# Patient Record
Sex: Female | Born: 1960 | Race: White | Hispanic: No | Marital: Married | Smoking: Never smoker
Health system: Southern US, Community
[De-identification: ages and names within clinical notes are randomized; demographics above are authoritative.]

## PROBLEM LIST (undated history)

## (undated) DIAGNOSIS — R87613 High grade squamous intraepithelial lesion on cytologic smear of cervix (HGSIL): Secondary | ICD-10-CM

## (undated) DIAGNOSIS — C801 Malignant (primary) neoplasm, unspecified: Secondary | ICD-10-CM

## (undated) DIAGNOSIS — N939 Abnormal uterine and vaginal bleeding, unspecified: Secondary | ICD-10-CM

## (undated) HISTORY — DX: High grade squamous intraepithelial lesion on cytologic smear of cervix (HGSIL): R87.613

---

## 2005-03-16 HISTORY — PX: DILATION AND CURETTAGE OF UTERUS: SHX78

## 2005-07-14 HISTORY — PX: OTHER SURGICAL HISTORY: SHX169

## 2013-06-22 ENCOUNTER — Emergency Department (HOSPITAL_COMMUNITY)
Admission: EM | Admit: 2013-06-22 | Discharge: 2013-06-22 | Disposition: A | Discharge status: Home / Self Care | Payer: BC Managed Care – PPO | Attending: Emergency Medicine | Admitting: Emergency Medicine

## 2013-06-22 ENCOUNTER — Encounter (HOSPITAL_COMMUNITY): Payer: Self-pay | Admitting: Emergency Medicine

## 2013-06-22 DIAGNOSIS — R209 Unspecified disturbances of skin sensation: Secondary | ICD-10-CM | POA: Insufficient documentation

## 2013-06-22 DIAGNOSIS — R110 Nausea: Secondary | ICD-10-CM | POA: Diagnosis present

## 2013-06-22 DIAGNOSIS — R112 Nausea with vomiting, unspecified: Secondary | ICD-10-CM | POA: Insufficient documentation

## 2013-06-22 DIAGNOSIS — R1110 Vomiting, unspecified: Secondary | ICD-10-CM | POA: Diagnosis present

## 2013-06-22 DIAGNOSIS — R51 Headache: Secondary | ICD-10-CM | POA: Insufficient documentation

## 2013-06-22 MED ORDER — DIPHENHYDRAMINE HCL 50 MG/ML IJ SOLN
12.5000 mg | Freq: Once | INTRAMUSCULAR | Status: AC
  Administered 2013-06-22: 08:00:00 via INTRAVENOUS
  Filled 2013-06-22: qty 1

## 2013-06-22 MED ORDER — ONDANSETRON 4 MG PO TBDP: ORAL_TABLET | ORAL | Status: DC

## 2013-06-22 MED ORDER — SODIUM CHLORIDE 0.9 % IV BOLUS (SEPSIS)
1000.0000 mL | INTRAVENOUS | Status: AC
  Administered 2013-06-22: 1000 mL via INTRAVENOUS

## 2013-06-22 MED ORDER — METOCLOPRAMIDE HCL 5 MG/ML IJ SOLN
5.0000 mg | Freq: Once | INTRAMUSCULAR | Status: AC
  Administered 2013-06-22: 5 mg via INTRAVENOUS
  Filled 2013-06-22: qty 2

## 2013-06-22 NOTE — ED Notes (Signed)
Pt transported via EMS from home with c/o emesis onset last evening. Dry heaving noted by EMS. #20 SL L hand established by EMS, Zofran 4mg  IVP given by EMS.

## 2013-06-22 NOTE — ED Notes (Signed)
Bed: TR32 Expected date:  Expected time:  Means of arrival:  Comments: EMS 55F Emesis VSS

## 2013-06-22 NOTE — ED Notes (Signed)
Ambulated in the hallway with no assistance. Patient denies any dizziness or weakness.

## 2013-06-22 NOTE — ED Provider Notes (Signed)
CSN: [id number]     Arrival date & time 06/22/13  4132 History   First MD Initiated Contact with Patient 06/22/13 0700     Chief Complaint  Patient presents with  . Emesis     (Consider location/radiation/quality/duration/timing/severity/associated sxs/prior Treatment) Patient is a 53 y.o. female presenting with vomiting. The history is provided by the patient.  Emesis Severity:  Mild Duration:  12 hours Timing:  Intermittent Quality:  Stomach contents Progression:  Worsening Chronicity:  New Recent urination:  Normal Relieved by:  Nothing Worsened by:  Nothing tried Ineffective treatments:  None tried Associated symptoms: headaches   Associated symptoms: no abdominal pain, no diarrhea and no fever     History reviewed. No pertinent past medical history. History reviewed. No pertinent past surgical history. No family history on file. History  Substance Use Topics  . Smoking status: Never Smoker   . Smokeless tobacco: Not on file  . Alcohol Use: No   OB History   Grav Para Term Preterm Abortions TAB SAB Ect Mult Living                 Review of Systems  Constitutional: Negative for fever and fatigue.  HENT: Negative for congestion and drooling.   Eyes: Negative for pain.  Respiratory: Negative for cough and shortness of breath.   Cardiovascular: Negative for chest pain.  Gastrointestinal: Positive for nausea and vomiting. Negative for abdominal pain and diarrhea.  Genitourinary: Negative for dysuria and hematuria.  Musculoskeletal: Negative for back pain, gait problem and neck pain.  Skin: Negative for color change.  Neurological: Positive for headaches. Negative for dizziness.       Paresthesias in her bilateral hands  Hematological: Negative for adenopathy.  Psychiatric/Behavioral: Negative for behavioral problems.  All other systems reviewed and are negative.     Allergies  Clindamycin/lincomycin  Home Medications  No current outpatient prescriptions on  file. BP 164/81  Pulse 62  Temp(Src) 97.4 F (36.3 C) (Oral)  Resp 16  SpO2 100% Physical Exam  Nursing note and vitals reviewed. Constitutional: She is oriented to person, place, and time. She appears well-developed and well-nourished.  Nauseous.   HENT:  Head: Normocephalic.  Mouth/Throat: Oropharynx is clear and moist. No oropharyngeal exudate.  Eyes: Conjunctivae and EOM are normal. Pupils are equal, round, and reactive to light.  Neck: Normal range of motion. Neck supple.  Cardiovascular: Normal rate, regular rhythm, normal heart sounds and intact distal pulses.  Exam reveals no gallop and no friction rub.   No murmur heard. Pulmonary/Chest: Effort normal and breath sounds normal. No respiratory distress. She has no wheezes.  Abdominal: Soft. Bowel sounds are normal. There is no tenderness. There is no rebound and no guarding.  Musculoskeletal: Normal range of motion. She exhibits no edema and no tenderness.  Neurological: She is alert and oriented to person, place, and time. She has normal strength. No cranial nerve deficit or sensory deficit. She displays a negative Romberg sign. Coordination and gait normal.  alert, oriented x3 speech: normal in context and clarity memory: intact grossly cranial nerves II-XII: intact motor strength: full proximally and distally no involuntary movements or tremors sensation: intact to light touch diffusely  cerebellar: finger-to-nose intact gait: deferred d/t vomiting on exam   Skin: Skin is warm and dry.  Psychiatric: She has a normal mood and affect. Her behavior is normal.    ED Course  Procedures (including critical care time) Labs Review Labs Reviewed - No data to display Imaging Review  No results found.   EKG Interpretation None      MDM   Final diagnoses:  Vomiting  Nausea    7:12 AM 53 y.o. female who presents with vomiting which began last night. She notes she was able to sleep through the night but awoke this  morning with continued vomiting. Shows a developed a headache which felt like pressure on the vertex of her head after vomiting. She denies this on exam now. She states that she is otherwise been well but there has been sick contacts at her work. She denies any abdominal pain, diarrhea, or fever. She is afebrile and vital signs are unremarkable here. Will treat symptomatically with IV fluid and nausea medicine.  8:17 AM: Pt feeling much better, would like to go. Likely viral syndrome. Ambulating w/out difficulty upon d/c home.  I have discussed the diagnosis/risks/treatment options with the patient and believe the pt to be eligible for discharge home to follow-up with pcp as needed. We also discussed returning to the ED immediately if new or worsening sx occur. We discussed the sx which are most concerning (e.g., inability to tolerate po, abd pain, inc vomiting) that necessitate immediate return. Medications administered to the patient during their visit and any new prescriptions provided to the patient are listed below.  Medications given during this visit Medications  sodium chloride 0.9 % bolus 1,000 mL (1,000 mLs Intravenous New Bag/Given 06/22/13 0727)  metoCLOPramide (REGLAN) injection 5 mg (5 mg Intravenous Given 06/22/13 0728)  diphenhydrAMINE (BENADRYL) injection 12.5 mg ( Intravenous Given 06/22/13 0730)    New Prescriptions   ONDANSETRON (ZOFRAN ODT) 4 MG DISINTEGRATING TABLET    4mg  ODT q4 hours prn nausea/vomit       Blanchard Kelch, MD 06/22/13 [phone]

## 2013-06-22 NOTE — Discharge Instructions (Signed)

## 2014-03-18 ENCOUNTER — Other Ambulatory Visit: Payer: Self-pay | Admitting: Family Medicine

## 2014-04-13 ENCOUNTER — Encounter: Payer: Self-pay | Admitting: Nurse Practitioner

## 2014-06-21 ENCOUNTER — Encounter: Payer: Self-pay | Admitting: Obstetrics and Gynecology

## 2014-06-21 ENCOUNTER — Ambulatory Visit (INDEPENDENT_AMBULATORY_CARE_PROVIDER_SITE_OTHER): Payer: BLUE CROSS/BLUE SHIELD | Admitting: Obstetrics and Gynecology

## 2014-06-21 VITALS — BP 126/80 | HR 76 | Resp 14 | Ht 63.5 in | Wt 168.0 lb

## 2014-06-21 DIAGNOSIS — Z1211 Encounter for screening for malignant neoplasm of colon: Secondary | ICD-10-CM | POA: Diagnosis not present

## 2014-06-21 DIAGNOSIS — Z0000 Encounter for general adult medical examination without abnormal findings: Secondary | ICD-10-CM

## 2014-06-21 DIAGNOSIS — R87613 High grade squamous intraepithelial lesion on cytologic smear of cervix (HGSIL): Secondary | ICD-10-CM

## 2014-06-21 DIAGNOSIS — N912 Amenorrhea, unspecified: Secondary | ICD-10-CM | POA: Diagnosis not present

## 2014-06-21 DIAGNOSIS — N644 Mastodynia: Secondary | ICD-10-CM

## 2014-06-21 DIAGNOSIS — Z01419 Encounter for gynecological examination (general) (routine) without abnormal findings: Secondary | ICD-10-CM

## 2014-06-21 HISTORY — DX: High grade squamous intraepithelial lesion on cytologic smear of cervix (HGSIL): R87.613

## 2014-06-21 LAB — POCT URINALYSIS DIPSTICK
BILIRUBIN UA: NEGATIVE
Blood, UA: NEGATIVE
GLUCOSE UA: NEGATIVE
Ketones, UA: NEGATIVE
Leukocytes, UA: NEGATIVE
NITRITE UA: NEGATIVE
PH UA: 5
Protein, UA: NEGATIVE
Urobilinogen, UA: NEGATIVE

## 2014-06-21 LAB — CBC
HCT: 37.9 % (ref 36.0–46.0)
HEMOGLOBIN: 12.5 g/dL (ref 12.0–15.0)
MCH: 27.2 pg (ref 26.0–34.0)
MCHC: 33 g/dL (ref 30.0–36.0)
MCV: 82.4 fL (ref 78.0–100.0)
MPV: 9.4 fL (ref 8.6–12.4)
Platelets: 329 10*3/uL (ref 150–400)
RBC: 4.6 MIL/uL (ref 3.87–5.11)
RDW: 13.9 % (ref 11.5–15.5)
WBC: 6.9 10*3/uL (ref 4.0–10.5)

## 2014-06-21 NOTE — Progress Notes (Signed)
Patient is scheduled for Bilateral Breast Diagnostic Mammogram and L Breast Ultrasound at [facility] on 06/27/14 at 1500 . Patient agreeable to time/date/location. Follow up appointment with Dr. Quincy Simmonds scheduled for 07/11/14.

## 2014-06-21 NOTE — Patient Instructions (Signed)
EXERCISE AND DIET:  We recommended that you start or continue a regular exercise program for good health. Regular exercise means any activity that makes your heart beat faster and makes you sweat.  We recommend exercising at least 30 minutes per day at least 3 days a week, preferably 4 or 5.  We also recommend a diet low in fat and sugar.  Inactivity, poor dietary choices and obesity can cause diabetes, heart attack, stroke, and kidney damage, among others.    ALCOHOL AND SMOKING:  Women should limit their alcohol intake to no more than 7 drinks/beers/glasses of wine (combined, not each!) per week. Moderation of alcohol intake to this level decreases your risk of breast cancer and liver damage. And of course, no recreational drugs are part of a healthy lifestyle.  And absolutely no smoking or even second hand smoke. Most people know smoking can cause heart and lung diseases, but did you know it also contributes to weakening of your bones? Aging of your skin?  Yellowing of your teeth and nails?  CALCIUM AND VITAMIN D:  Adequate intake of calcium and Vitamin D are recommended.  The recommendations for exact amounts of these supplements seem to change often, but generally speaking 600 mg of calcium (either carbonate or citrate) and 800 units of Vitamin D per day seems prudent. Certain women may benefit from higher intake of Vitamin D.  If you are among these women, your doctor will have told you during your visit.    PAP SMEARS:  Pap smears, to check for cervical cancer or precancers,  have traditionally been done yearly, although recent scientific advances have shown that most women can have pap smears less often.  However, every woman still should have a physical exam from her gynecologist every year. It will include a breast check, inspection of the vulva and vagina to check for abnormal growths or skin changes, a visual exam of the cervix, and then an exam to evaluate the size and shape of the uterus and  ovaries.  And after 54 years of age, a rectal exam is indicated to check for rectal cancers. We will also provide age appropriate advice regarding health maintenance, like when you should have certain vaccines, screening for sexually transmitted diseases, bone density testing, colonoscopy, mammograms, etc.   MAMMOGRAMS:  All women over 40 years old should have a yearly mammogram. Many facilities now offer a "3D" mammogram, which may cost around $50 extra out of pocket. If possible,  we recommend you accept the option to have the 3D mammogram performed.  It both reduces the number of women who will be called back for extra views which then turn out to be normal, and it is better than the routine mammogram at detecting truly abnormal areas.    COLONOSCOPY:  Colonoscopy to screen for colon cancer is recommended for all women at age 50.  We know, you hate the idea of the prep.  We agree, BUT, having colon cancer and not knowing it is worse!!  Colon cancer so often starts as a polyp that can be seen and removed at colonscopy, which can quite literally save your life!  And if your first colonoscopy is normal and you have no family history of colon cancer, most women don't have to have it again for 10 years.  Once every ten years, you can do something that may end up saving your life, right?  We will be happy to help you get it scheduled when you are ready.    Be sure to check your insurance coverage so you understand how much it will cost.  It may be covered as a preventative service at no cost, but you should check your particular policy.     Exercise to Lose Weight Exercise and a healthy diet may help you lose weight. Your doctor may suggest specific exercises. EXERCISE IDEAS AND TIPS  Choose low-cost things you enjoy doing, such as walking, bicycling, or exercising to workout videos.  Take stairs instead of the elevator.  Walk during your lunch break.  Park your car further away from work or  school.  Go to a gym or an exercise class.  Start with 5 to 10 minutes of exercise each day. Build up to 30 minutes of exercise 4 to 6 days a week.  Wear shoes with good support and comfortable clothes.  Stretch before and after working out.  Work out until you breathe harder and your heart beats faster.  Drink extra water when you exercise.  Do not do so much that you hurt yourself, feel dizzy, or get very short of breath. Exercises that burn about 150 calories:  Running 1  miles in 15 minutes.  Playing volleyball for 45 to 60 minutes.  Washing and waxing a car for 45 to 60 minutes.  Playing touch football for 45 minutes.  Walking 1  miles in 35 minutes.  Pushing a stroller 1  miles in 30 minutes.  Playing basketball for 30 minutes.  Raking leaves for 30 minutes.  Bicycling 5 miles in 30 minutes.  Walking 2 miles in 30 minutes.  Dancing for 30 minutes.  Shoveling snow for 15 minutes.  Swimming laps for 20 minutes.  Walking up stairs for 15 minutes.  Bicycling 4 miles in 15 minutes.  Gardening for 30 to 45 minutes.  Jumping rope for 15 minutes.  Washing windows or floors for 45 to 60 minutes. Document Released: 04/04/2010 Document Revised: 05/25/2011 Document Reviewed: 04/04/2010 [facility] Patient Information 2015 [place], [place]. This information is not intended to replace advice given to you by your health care provider. Make sure you discuss any questions you have with your health care provider.

## 2014-06-21 NOTE — Progress Notes (Signed)
Patient ID: Teresa Kaiser, female   DOB: 1961/02/08, 54 y.o.   MRN: [id number] 54 y.o. G1P1001 MarriedCaucasianF here for annual exam.   Left breast pain at 9:00.  No mass felt.   Hot flashes. No prior HRT.  Has gained about 20 pounds.   Mother decreased with endometrial cancer at age 22 years old.   Works with the blind.   PCP:  [facility]  Patient's last menstrual period was 10/14/2012 (approximate).          Sexually active: Yes.   female partner The current method of family planning is post menopausal status.    Exercising: No.  none. Smoker:  no  Health Maintenance: Pap:  2-3 years [id number] History of abnormal Pap:  no MMG:  2013 normal in [place] Colonoscopy:  never BMD:   n/a TDaP:  Unsure.   Screening Labs:   Hb today: 12.1, Urine today: neg   reports that she has never smoked. She does not have any smokeless tobacco history on file. She reports that she does not drink alcohol or use illicit drugs.  No past medical history on file.  Past Surgical History  Procedure Laterality Date  . Fatty tumor removal  07/2005    --benign--under left breast-was actually on ribcage--done in [facility]    No current outpatient prescriptions on file.   No current facility-administered medications for this visit.    Family History  Problem Relation Age of Onset  . Cancer Mother 2    dec--?endometrial ca  . Heart attack Father 41    dec    ROS:  Pertinent items are noted in HPI.  Otherwise, a comprehensive ROS was negative.  Exam:   BP 126/80 mmHg  Pulse 76  Resp 14  Ht 5' 3.5" (1.613 m)  Wt 168 lb (76.204 kg)  BMI 29.29 kg/m2  LMP 10/14/2012 (Approximate)      Height: 5' 3.5" (161.3 cm)  Ht Readings from Last 3 Encounters:  06/21/14 5' 3.5" (1.613 m)    General appearance: alert, cooperative and appears stated age Head: Normocephalic, without obvious abnormality, atraumatic Neck: no adenopathy, supple, symmetrical, trachea midline and  thyroid normal to inspection and palpation Lungs: clear to auscultation bilaterally Breasts: normal appearance, no masses or tenderness, Inspection negative, No nipple retraction or dimpling, No nipple discharge or bleeding, No axillary or supraclavicular adenopathy Heart: regular rate and rhythm Abdomen: soft, non-tender; bowel sounds normal; no masses,  no organomegaly Extremities: extremities normal, atraumatic, no cyanosis or edema Skin: Skin color, texture, turgor normal. No rashes or lesions Lymph nodes: Cervical, supraclavicular, and axillary nodes normal. No abnormal inguinal nodes palpated Neurologic: Grossly normal   Pelvic: External genitalia:  no lesions              Urethra:  normal appearing urethra with no masses, tenderness or lesions              Bartholins and Skenes: normal                 Vagina: normal appearing vagina with normal color and discharge, no lesions              Cervix: no lesions              Pap taken: Yes.   Bimanual Exam:  Uterus:  normal size, contour, position, consistency, mobility, non-tender              Adnexa: normal adnexa and no mass, fullness, tenderness  Rectovaginal: Confirms               Anus:  normal sphincter tone, no lesions  Chaperone was present for exam.  A:  Well Woman with normal exam Left breast pain.  Weight gain.  Menopausal symptoms.  Family history of uterine cancer.   P:   Bilateral diagnostic mammogram and possible left breast ultrasound.  Will schedule for patient.  pap smear and HR HPV.  Check FSH, estradiol, TSH, lipids, CMP, CBC.  Discussed treatment of hot flashes - HRT, Brisdelle, Effexor, herbal options.  Discussed weight loss and increasing exercise.  Patient will return for a talking visit after mammogram done and labs back.  Will determine treatment for menopausal symptoms then.  Reading about menopause to patient in written form. TDap was not given prior to discharge from visit.  Will need  to be given at another appointment. return annually or prn

## 2014-06-22 LAB — COMPREHENSIVE METABOLIC PANEL
ALBUMIN: 4.2 g/dL (ref 3.5–5.2)
ALT: 18 U/L (ref 0–35)
AST: 16 U/L (ref 0–37)
Alkaline Phosphatase: 65 U/L (ref 39–117)
BUN: 13 mg/dL (ref 6–23)
CHLORIDE: 104 meq/L (ref 96–112)
CO2: 29 meq/L (ref 19–32)
Calcium: 9.2 mg/dL (ref 8.4–10.5)
Creat: 0.6 mg/dL (ref 0.50–1.10)
Glucose, Bld: 76 mg/dL (ref 70–99)
POTASSIUM: 4.8 meq/L (ref 3.5–5.3)
Sodium: 141 mEq/L (ref 135–145)
Total Bilirubin: 0.3 mg/dL (ref 0.2–1.2)
Total Protein: 7.3 g/dL (ref 6.0–8.3)

## 2014-06-22 LAB — LIPID PANEL
Cholesterol: 199 mg/dL (ref 0–200)
HDL: 35 mg/dL — AB (ref >=46)
LDL CALC: 112 mg/dL — AB (ref 0–99)
TRIGLYCERIDES: 258 mg/dL — AB (ref <150)
Total CHOL/HDL Ratio: 5.7 Ratio
VLDL: 52 mg/dL — ABNORMAL HIGH (ref 0–40)

## 2014-06-22 LAB — ESTRADIOL: ESTRADIOL: 15.6 pg/mL

## 2014-06-22 LAB — FOLLICLE STIMULATING HORMONE: FSH: 85.4 m[IU]/mL

## 2014-06-22 LAB — TSH: TSH: 1.849 u[IU]/mL (ref 0.350–4.500)

## 2014-06-25 LAB — HEMOGLOBIN, FINGERSTICK: Hemoglobin, fingerstick: 12.1 g/dL (ref 12.0–16.0)

## 2014-06-27 ENCOUNTER — Ambulatory Visit
Admission: RE | Admit: 2014-06-27 | Discharge: 2014-06-27 | Disposition: A | Discharge status: Home / Self Care | Payer: BLUE CROSS/BLUE SHIELD | Source: Ambulatory Visit | Attending: Obstetrics and Gynecology | Admitting: Obstetrics and Gynecology

## 2014-06-27 ENCOUNTER — Other Ambulatory Visit: Payer: Self-pay | Admitting: Obstetrics and Gynecology

## 2014-06-27 DIAGNOSIS — R921 Mammographic calcification found on diagnostic imaging of breast: Secondary | ICD-10-CM

## 2014-06-27 DIAGNOSIS — N644 Mastodynia: Secondary | ICD-10-CM

## 2014-06-27 LAB — IPS PAP TEST WITH HPV

## 2014-06-28 ENCOUNTER — Telehealth: Payer: Self-pay | Admitting: Obstetrics and Gynecology

## 2014-06-28 NOTE — Telephone Encounter (Signed)
Patient returning call. She will be available at 12:00 PM if you get a chance to call her back then.

## 2014-06-28 NOTE — Telephone Encounter (Signed)
Left message for patient to call back. Need to advise that she is scheduled with Dr Collene Mares 04.19.2016 @ [phone].

## 2014-06-28 NOTE — Telephone Encounter (Signed)
Patient returned call. I advised her of the appointment with Dr Collene Mares and gave her there location and contact information. Patient agreeable.

## 2014-06-28 NOTE — Telephone Encounter (Signed)
Left message for patient to call back  

## 2014-07-02 ENCOUNTER — Telehealth: Payer: Self-pay | Admitting: Emergency Medicine

## 2014-07-02 DIAGNOSIS — R87613 High grade squamous intraepithelial lesion on cytologic smear of cervix (HGSIL): Secondary | ICD-10-CM

## 2014-07-02 NOTE — Telephone Encounter (Signed)
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  She needs a colposcopy with me.  Cc- Marisa Sprinkles

## 2014-07-02 NOTE — Telephone Encounter (Signed)
Patient notified of message from Dr. Quincy Simmonds.  She is agreeable to scheduling colposcopy.  Brief description of procedure given to patient.  Colposcopy pre-procedure instructions given. Patient is post menopausal.  Make sure to eat a meal and hydrate before appointment.  Advised 800 mg of Motrin with food one hour prior to appointment. Motrin/Advil or Ibuprofen. Take 800 mg (Can purchase over the counter, you will need four 200 mg pills).  Advised will need to cancel or reschedule within 72 hours or will have $150.00 no show fee placed to account.   Patient verbalized understanding of preprocedure instructions and cancellation policy. HRT consult appointment scheduled for 07/11/14 and colposcopy appointment is scheduled for 07/12/14 with Dr. Quincy Simmonds.  Patient is advised she would be contacted with insurance coverage information.   Routing to provider for final review. Patient agreeable to disposition. Will close encounter

## 2014-07-11 ENCOUNTER — Ambulatory Visit: Payer: BLUE CROSS/BLUE SHIELD | Admitting: Obstetrics and Gynecology

## 2014-07-12 ENCOUNTER — Ambulatory Visit (INDEPENDENT_AMBULATORY_CARE_PROVIDER_SITE_OTHER): Payer: BLUE CROSS/BLUE SHIELD | Admitting: Obstetrics and Gynecology

## 2014-07-12 ENCOUNTER — Encounter: Payer: Self-pay | Admitting: Obstetrics and Gynecology

## 2014-07-12 DIAGNOSIS — R87613 High grade squamous intraepithelial lesion on cytologic smear of cervix (HGSIL): Secondary | ICD-10-CM | POA: Diagnosis not present

## 2014-07-12 NOTE — Progress Notes (Signed)
Subjective:     Patient ID: Teresa Kaiser, female   DOB: [birth year], 54 y.o.   MRN: [id number]  HPI  Pap showing HGSIL and positive HR HPV.  Thinks she had an abnormal pap many years ago.  No prior colposcopy.  LMP - 2014 Contraception - postmenopausal.   Review of Systems     Objective:   Physical Exam  Genitourinary:        Procedure  Colposcopy of vagina, cervix, and vulva.  Consent for procedure.  Speculum placed in vagina.  3% acetic acid used.  Satisfactory colposcopy.  ECC performed.  Very large squamocolumnar junction.  Islands of acetowhite thickening at 10:00.  Biopsy taken. This was within the ectropion. Raised erythematous area at 16:00.  Biopsy taken.  This was also within the large ectropion.  Monsel's placed.  Minimal EBL.   Colposcopy of the vulva with 3% acetic acid soaked gauze.  No external lesions.  No biopsy needed.  No complications.     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     Plan:     Extensive discussion of abnormal paps, HPV, colposcopy and possible treatment with LEEP or cold knife conization.  Follow up biopsy results.  Instructions post colposcopy given.  Follow up in 7 - 10 days for office discussion.  Treatment is anticipated.   ___15____ minutes face to face time of which over 50% was spent in counseling.   After visit summary to patient.

## 2014-07-12 NOTE — Patient Instructions (Signed)
Colposcopy Care After Colposcopy is a procedure in which a special tool is used to magnify the surface of the cervix. A tissue sample (biopsy) may also be taken. This sample will be looked at for cervical cancer or other problems. After the test:  You may have some cramping.  Lie down for a few minutes if you feel lightheaded.   You may have some bleeding which should stop in a few days. HOME CARE  Do not have sex or use tampons for 2 to 3 days or as told.  Only take medicine as told by your doctor.  Continue to take your birth control pills as usual. Finding out the results of your test Ask when your test results will be ready. Make sure you get your test results. GET HELP RIGHT AWAY IF:  You are bleeding a lot or are passing blood clots.  You develop a fever of 102 F (38.9 C) or higher.  You have abnormal vaginal discharge.  You have cramps that do not go away with medicine.  You feel lightheaded, dizzy, or pass out (faint). MAKE SURE YOU:   Understand these instructions.  Will watch your condition.  Will get help right away if you are not doing well or get worse. Document Released: 08/19/2007 Document Revised: 05/25/2011 Document Reviewed: 09/29/2012 [facility] Patient Information 2015 [place], [place]. This information is not intended to replace advice given to you by your health care provider. Make sure you discuss any questions you have with your health care provider.

## 2014-07-17 LAB — IPS OTHER TISSUE BIOPSY

## 2014-07-18 ENCOUNTER — Telehealth: Payer: Self-pay | Admitting: Emergency Medicine

## 2014-07-18 NOTE — Telephone Encounter (Signed)
Spoke with patient and message from Dr. Quincy Simmonds. Patient thankful to have results and agreeable to move up appointment for consult regarding results and treatment.  Appointment for next week remains scheduled in case additional follow up is needed.  Routing to provider for final review. Patient agreeable to disposition. Patient aware MD will review message and nurse will return call with any additional instructions or change of disposition. Will close encounter.

## 2014-07-18 NOTE — Telephone Encounter (Signed)
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  We will need to discuss treatment.  She has an appointment to discuss the colpo results next week. I will be happy to move her appointment up to tomorrow morning if she would like this.

## 2014-07-19 ENCOUNTER — Encounter: Payer: Self-pay | Admitting: Obstetrics and Gynecology

## 2014-07-19 ENCOUNTER — Ambulatory Visit (INDEPENDENT_AMBULATORY_CARE_PROVIDER_SITE_OTHER): Payer: BLUE CROSS/BLUE SHIELD | Admitting: Obstetrics and Gynecology

## 2014-07-19 VITALS — BP 100/68 | HR 74 | Ht 63.5 in | Wt 164.4 lb

## 2014-07-19 DIAGNOSIS — D069 Carcinoma in situ of cervix, unspecified: Secondary | ICD-10-CM | POA: Diagnosis not present

## 2014-07-19 NOTE — Progress Notes (Signed)
Patient ID: Teresa Kaiser, female   DOB: 10/30/1960, 54 y.o.   MRN: [id number]  GYNECOLOGY  VISIT   HPI: 54 y.o.   Married  Caucasian  female   G1P1001 with Patient's last menstrual period was 10/14/2012 (approximate).   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   Husband travels to [place] on May 25th.  He will be away until June 15th.   GYNECOLOGIC HISTORY: Patient's last menstrual period was 10/14/2012 (approximate).          OB History    Gravida Para Term Preterm AB TAB SAB Ectopic Multiple Living   1 1 1       1          Patient Active Problem List   Diagnosis Date Noted  . Nausea 06/22/2013  . Vomiting 06/22/2013    Past Medical History  Diagnosis Date  . 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 Fatty tumor removal  07/2005    --benign--under left breast-was actually on ribcage--done in [facility]  . Dilation and curettage of uterus  03/2005    DUB--neg. for hyperplasia or dysplasia/Dr. Sena Slate    Current Outpatient Prescriptions  Medication Sig Dispense Refill  . Vitamin D, Ergocalciferol, (DRISDOL) 50000 UNITS CAPS capsule Take 50,000 Units by mouth once a week.  0   No current facility-administered medications for this visit.     ALLERGIES: Clindamycin/lincomycin  Family History  Problem Relation Age of Onset  . Cancer Mother 70    dec--?endometrial ca  . Heart attack Father 29    dec    History   Social History  . Marital Status: Married    Spouse Name: N/A  . Number of Children: N/A  . Years of Education: N/A   Occupational History  . Not on file.   Social History Main Topics  . Smoking status: Never Smoker   . Smokeless tobacco: Not on file  . Alcohol Use: No  . Drug Use: No  . Sexual Activity:    Partners: Male    Birth Control/ Protection: Post-menopausal   Other Topics Concern   . Not on file   Social History Narrative    ROS:  Pertinent items are noted in HPI.  PHYSICAL EXAMINATION:    BP 100/68 mmHg  Pulse 74  Ht 5' 3.5" (1.613 m)  Wt 164 lb 6.4 oz (74.571 kg)  BMI 28.66 kg/m2  LMP 10/14/2012 (Approximate)     General appearance: alert, cooperative and appears stated age   ASSESSMENT  CIN III. FH of endometrial cancer in mother.   PLAN  Discussed CIN III.  Discussed proceeding with cold knife conization and fractional dilation and curettage.  Risks, benefits, and alternatives reviewed with patient who wishes to proceed.  Risks include but are not limited to bleeding, infection, damage to surrounding organs, need for further surgery including laparoscopy if uterine perforation occurs or future hysterectomy for treatment of recurrent dysplasia or if cancer were found, continued HPV and risk of vaginal dysplasia, DVT, PE, death, reaction to anesthesia.  Surgical expectations and recovery discussed.  Patient wishes to proceed.    An After Visit Summary was printed and given to the patient.  __15____ minutes face to face time of which over 50% was spent in counseling.

## 2014-07-19 NOTE — Patient Instructions (Signed)
Conization of the Cervix  Cervical conization is the cutting (excision) of a cone-shaped portion of the cervix. The procedure is performed through the vagina in either your health care provider's office or an operating room. This procedure is usually done when there is abnormal bleeding from the cervix. It can also be done to evaluate an abnormal Pap test or if an abnormality is seen on the cervix during an exam. The tissue is then examined to see if there are precancerous cells or cancer present.   Conization of the cervix is not done during a menstrual period or pregnancy.   LET YOUR HEALTH CARE PROVIDER KNOW ABOUT:  · Any allergies you have.    · All medicines you are taking, including vitamins, herbs, eye drops, creams, and over-the-counter medicines.    · Previous problems you or members of your family have had with the use of anesthetics.    · Any blood disorders you have.    · Previous surgeries you have had.    · Medical conditions you have.    · Your smoking habits.    · The possibility of being pregnant.    RISKS AND COMPLICATIONS   Generally, conization of the cervix is a safe procedure. However, as with any procedure, complications can occur. Possible complications include:  · Heavy bleeding several days or weeks after the procedure. Light bleeding or spotting after the procedure is normal.  · Infection (rare).  · Damage to the cervix or surrounding organs (uncommon).    · Problems with the anesthesia.    · Increased risk of preterm labor in future pregnancies.  BEFORE THE PROCEDURE  · Do not eat or drink anything for 6-8 hours before the procedure.    · Do not take aspirin or blood thinners for at least a week before the procedure or as directed by your health care provider.    · Arrange for someone to take you home after the procedure.    PROCEDURE  There are three different methods to perform conization of the cervix. These include:   · The cold knife method. In this method a small cone-shaped sample  of tissue is cut out with a knife (scalpel) from the cervical canal and the transformation zone (where the normal cells end and the abnormal cells begin).    · The LEEP method. In this method a small cone-shaped sample of tissue is cut out with a thin wire that can burn (cauterize) the cervical tissue with an electrical current.    · Laser treatment. In this method a small cone-shaped sample of tissue is cut out and then cauterized with a laser beam to prevent bleeding.    The procedure will be performed as follows:   · Depending on the method, you will either be given a medicine to make you sleep (general anesthetic) or a numbing medicine (local anesthetic). A medicine that numbs the cervix (cervical block) may be given.    · A lubricated device called a speculum will be inserted into the vagina to spread open the walls of the vagina. This will help your health care provider see the inside of the vagina and cervix better.    · The tissue from the cervix will be removed and examined.    · The results of the procedure will help your health care provider decide if further treatment is necessary. They will also help your health care provider decide on the best treatment if your results are abnormal.  AFTER THE PROCEDURE  · If you had a general anesthetic, you may be groggy for 2-3 hours after   to 3 weeks.   You may have some cramping for about 1 week.   You may have bloody discharge or light bleeding for 1-2 weeks.   You may have black discharge coming from the vagina. This is from the paste used on the cervix to prevent bleeding. This is normal discharge.  Document Released: 12/10/2004 Document Revised: 03/07/2013 Document Reviewed: 08/26/2012 [facility] Patient Information 2015 [place], [place]. This information is not intended to replace advice  given to you by your health care provider. Make sure you discuss any questions you have with your health care provider.  Cervical Dysplasia Cervical dysplasia is a condition in which a woman has abnormal changes in the cells of her cervix. The cervix is the opening to the uterus (womb). It is located between the vagina and the uterus. Cervical dysplasia may be the first sign of cervical cancer.  With early detection, treatment, and close follow-up care, nearly all cases of cervical dysplasia can be cured. If left untreated, dysplasia may become more severe.  CAUSES  Cervical dysplasia can be caused by a human papillomavirus (HPV) infection. RISK FACTORS   Having had a sexually transmitted disease, such as chlamydia or a human papillomavirus (HPV) infection.   Becoming sexually active before age 61.   Having had more than one sexual partner.   Not using protection during sexual intercourse, especially with new sexual partners.   Having had cancer of the vagina or vulva.   Having a sexual partner whose previous partner had cancer of the cervix or cervical dysplasia.   Having a sexual partner who has or has had cancer of the penis.   Having a weakened immune system (such as from having HIV or an organ transplant).   Being the daughter of a woman who took diethylstilbestrol(DES) during pregnancy.   Having a family history of cervical cancer.   Smoking. SIGNS AND SYMPTOMS  There are usually no symptoms. If there are symptoms, they may include:   Abnormal vaginal discharge.   Bleeding between periods or after intercourse.   Bleeding during menopause.   Pain during sexual intercourse (dyspareunia). DIAGNOSIS  A test called a Pap test may be done.During this test, cells are taken from the cervix and then looked at under a microscope. A test in which tissue is removed from the cervix (biopsy) may also be done if the Pap test is abnormal or if the cervix looks abnormal.   TREATMENT  Treatment varies based on the severity of the cervical dysplasia. Treatment may include:  Cryotherapy. During cryotherapy, the abnormal cells are frozen with a steel-tip instrument.   A procedure to remove abnormal tissue from the cervix.  Surgery to remove abnormal tissue. This is usually done in serious cases of cervical dysplasia. Surgical options include:  A cone biopsy. This is a procedure in which the cervical canal and a portion of the center of the cervix are removed.   Hysterectomy. This is a surgery in which the uterus and cervix are removed. HOME CARE INSTRUCTIONS   Only take over-the-counter or prescription medicines for pain or discomfort as directed by your health care provider.   Do not use tampons, have sexual intercourse, or douche until your health care provider says it is okay.  Keep follow-up appointments as directed by your health care provider. Women who have been treated for cervical dysplasia should have regular pelvic exams and Pap tests. During the first year following treatment of cervical dysplasia, Pap tests should be done every 3-4 months.  In the second year, they should be done every 6 months or as recommended by your health care provider.  To prevent the condition from developing again, practice safe sex. SEEK MEDICAL CARE IF:  You develop genital warts.  SEEK IMMEDIATE MEDICAL CARE IF:   Your menstrual period is heavier than normal.   You develop bright red bleeding, especially if you have blood clots.   You have a fever.   You have increasing cramps or pain not relieved with medicine.   You are light-headed, unusually weak, or have fainting spells.   You have abnormal vaginal discharge.   You have abdominal pain. Document Released: 03/02/2005 Document Revised: 03/07/2013 Document Reviewed: 10/26/2012 [facility] Patient Information 2015 [place], [place]. This information is not intended to replace advice given to you by  your health care provider. Make sure you discuss any questions you have with your health care provider.

## 2014-07-23 ENCOUNTER — Telehealth: Payer: Self-pay | Admitting: Obstetrics and Gynecology

## 2014-07-23 NOTE — Telephone Encounter (Signed)
Spoke with patient. Advised of benefit quote received for the surgeon portion of her surgery. Advised that payment is due in full at least 3 weeks prior to the scheduled surgery date. Patient agreeable. Advised patient that she will receive separate communication from the hospital regarding facility charges/fees/payment.  Advised that she will be hearing from [facility] regarding scheduling.

## 2014-07-24 NOTE — Telephone Encounter (Signed)
Call to patient. States she is very anxious to schedule surgery as soon as possible. Scheduling policies and dates options discussed.  Patient again states she will take first available date. Advised I will check and see how soon we can get her scheduled and call her back.

## 2014-07-24 NOTE — Telephone Encounter (Signed)
Pt calling to schedule surgery.

## 2014-07-25 ENCOUNTER — Ambulatory Visit: Payer: BLUE CROSS/BLUE SHIELD | Admitting: Obstetrics and Gynecology

## 2014-07-25 ENCOUNTER — Encounter: Payer: Self-pay | Admitting: Obstetrics and Gynecology

## 2014-07-26 NOTE — Telephone Encounter (Signed)
Return call to patient. Notified surgery scheduled for Tuesday 08-21-14 at 109 at [facility]. Surgery instruction sheet reviewed and printed copy will be mailed to patient, see scanned copy.  Routing to provider for final review. Patient agreeable to disposition. Patient aware provider will review message and nurse will return call with any additional instructions or change of disposition. Will close encounter.

## 2014-07-26 NOTE — Telephone Encounter (Signed)
First available date is August 21, 2014. Case request sent to centralized scheduling.  Call to patient, left message to call back.

## 2014-07-26 NOTE — Telephone Encounter (Signed)
Returning call.

## 2014-07-26 NOTE — Telephone Encounter (Signed)
Patient called to check on status of the request.

## 2014-07-30 ENCOUNTER — Encounter: Payer: Self-pay | Admitting: Obstetrics and Gynecology

## 2014-07-30 ENCOUNTER — Ambulatory Visit (INDEPENDENT_AMBULATORY_CARE_PROVIDER_SITE_OTHER): Payer: BLUE CROSS/BLUE SHIELD | Admitting: Obstetrics and Gynecology

## 2014-07-30 VITALS — BP 138/80 | HR 80 | Ht 63.5 in | Wt 164.8 lb

## 2014-07-30 DIAGNOSIS — N879 Dysplasia of cervix uteri, unspecified: Secondary | ICD-10-CM | POA: Diagnosis not present

## 2014-07-30 NOTE — Progress Notes (Signed)
Patient ID: Teresa Kaiser, female   DOB: [birth year], 54 y.o.   MRN: [id number] GYNECOLOGY  VISIT   HPI: 54 y.o.   Married  Caucasian  female   G1P1001 with Patient's last menstrual period was 10/14/2012 (approximate).   here to discuss surgery.  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   Worried about post op pain.    Asking about weight gain.   GYNECOLOGIC HISTORY: Patient's last menstrual period was 10/14/2012 (approximate). Contraception:postmenopausal Menopausal hormone therapy: n/a Last mammogram: 06-27-14 Diag. And U/S probable benign bilateral breast calcifications. Probably benign Rt. Breast cluster of cysts. Rec. Diag. With possible u/s in 6 mo.:[facility] Last pap smear: 06-21-14 HGSIL:Pos. HR HPV        OB History    Gravida Para Term Preterm AB TAB SAB Ectopic Multiple Living   1 1 1       1          Patient Active Problem List   Diagnosis Date Noted  . Nausea 06/22/2013  . Vomiting 06/22/2013    Past Medical History  Diagnosis Date  . 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 Fatty tumor removal  07/2005    --benign--under left breast-was actually on ribcage--done in [facility]  . Dilation and curettage of uterus  03/2005    DUB--neg. for hyperplasia or dysplasia/Dr. Sena Slate    Current Outpatient Prescriptions  Medication Sig Dispense Refill  . Vitamin D, Ergocalciferol, (DRISDOL) 50000 UNITS CAPS capsule Take 50,000 Units by mouth once a week.  0   No current facility-administered medications for this visit.     ALLERGIES: Clindamycin/lincomycin  Family History  Problem Relation Age of Onset  . Cancer Mother 61    dec--?endometrial ca  . Heart attack Father 46    dec    History   Social History  . Marital Status: Married    Spouse Name: N/A   . Number of Children: N/A  . Years of Education: N/A   Occupational History  . Not on file.   Social History Main Topics  . Smoking status: Never Smoker   . Smokeless tobacco: Not on file  . Alcohol Use: No  . Drug Use: No  . Sexual Activity:    Partners: Male    Birth Control/ Protection: Post-menopausal   Other Topics Concern  . Not on file   Social History Narrative    ROS:  Pertinent items are noted in HPI.  PHYSICAL EXAMINATION:    BP 138/80 mmHg  Pulse 80  Ht 5' 3.5" (1.613 m)  Wt 164 lb 12.8 oz (74.753 kg)  BMI 28.73 kg/m2  LMP 10/14/2012 (Approximate)    General appearance: alert, cooperative and appears stated age Head: Normocephalic, without obvious abnormality, atraumatic Neck: no adenopathy, supple, symmetrical, trachea midline and thyroid normal to inspection and palpation Lungs: clear to auscultation bilaterally Heart: regular rate and rhythm Abdomen: soft, non-tender; bowel sounds normal; no masses,  no organomegaly Extremities: extremities normal, atraumatic, no cyanosis or edema Skin: Skin color, texture, turgor normal. No rashes or lesions Lymph nodes: Cervical, supraclavicular, and axillary nodes normal. No abnormal inguinal nodes palpated Neurologic: Grossly normal  Pelvic: External genitalia:  no lesions              Urethra:  normal appearing urethra with  no masses, tenderness or lesions              Bartholins and Skenes: normal                 Vagina: normal appearing vagina with normal color and discharge, no lesions              Cervix: no lesions             Bimanual Exam:  Uterus:  normal size, contour, position, consistency, mobility, non-tender              Adnexa: normal adnexa and no mass, fullness, tenderness              Chaperone was present for exam.  ASSESSMENT  CIN III. Weight gain.   PLAN  CIN III discussed. Proceed with cold knife conization of cervix with fractional dilation and curettage.  Risks, benefits, and  alternative reviewed with patient who wishes to proceed.  I discussed that scarring post op of the cervix with cold knife conization can occur that makes future evaluation more difficult.  Patient understands that cervical dysplasia can recur as the treatment does not remove the HPV from her tissues. Discussed surgical recovery expectations and management of post op pain with oral medication - Percocet and Motrin.  Will address weight gain after surgery.  Discussed referral to dietician.    An After Visit Summary was printed and given to the patient.  _25____ minutes face to face time of which over 50% was spent in counseling.

## 2014-07-31 ENCOUNTER — Telehealth: Payer: Self-pay | Admitting: Obstetrics and Gynecology

## 2014-07-31 NOTE — Telephone Encounter (Signed)
Pt states she gave Dr Quincy Simmonds her FMLA paperwork yesterday. Pt states per her manager she does not need to have the forms filled out based on the days she will be absent. Pt would like to pick up forms from front office later this week.

## 2014-07-31 NOTE — Telephone Encounter (Signed)
Left message for patient to call back  

## 2014-07-31 NOTE — Telephone Encounter (Signed)
Left message for patient to call back. Need to discuss collection of surgery payment

## 2014-07-31 NOTE — Telephone Encounter (Signed)
Spoke with patient. Surgery payment collected.

## 2014-07-31 NOTE — Telephone Encounter (Signed)
Returning a call to Sabrina. °

## 2014-08-01 NOTE — Telephone Encounter (Signed)
Thank you for the update.  FMLA papers are with nursing supervisor to hold until we received this information.   Cc- Lamont Snowball

## 2014-08-02 ENCOUNTER — Encounter: Payer: Self-pay | Admitting: Obstetrics and Gynecology

## 2014-08-07 NOTE — Telephone Encounter (Signed)
Call to patient. Notified that surgery has been moved up to 0900 on 08-21-14 and needs to arrive at 0730. Patient desires to pick up FMLA forms that she dropped off. She does not need these completed. Offered to shred forms for patient, she prefers to pick up.  Will come by on 5-26- or 08-10-14. Forms are in my office.

## 2014-08-10 NOTE — Telephone Encounter (Signed)
Patient picked up blank FMLA forms on 08-09-14. Encounter closed.

## 2014-08-18 NOTE — Anesthesia Preprocedure Evaluation (Addendum)
Anesthesia Evaluation  Patient identified by MRN, date of birth, ID band Patient awake    Reviewed: Allergy & Precautions, NPO status , Patient's Chart, lab work & pertinent test results, reviewed documented beta blocker date and time   Airway Mallampati: III   Neck ROM: Full    Dental  (+) Dental Advisory Given,    Pulmonary neg pulmonary ROS,  breath sounds clear to auscultation        Cardiovascular negative cardio ROS  Rhythm:Regular     Neuro/Psych negative psych ROS   GI/Hepatic negative GI ROS, Neg liver ROS,   Endo/Other  negative endocrine ROS  Renal/GU negative Renal ROS     Musculoskeletal   Abdominal (+)  Abdomen: soft.    Peds  Hematology Hbg 12   Anesthesia Other Findings   Reproductive/Obstetrics                            Anesthesia Physical Anesthesia Plan  ASA: I  Anesthesia Plan: General   Post-op Pain Management:    Induction: Intravenous  Airway Management Planned: LMA  Additional Equipment:   Intra-op Plan:   Post-operative Plan: Extubation in OR  Informed Consent: I have reviewed the patients History and Physical, chart, labs and discussed the procedure including the risks, benefits and alternatives for the proposed anesthesia with the patient or authorized representative who has indicated his/her understanding and acceptance.     Plan Discussed with:   Anesthesia Plan Comments:         Anesthesia Quick Evaluation

## 2014-08-20 ENCOUNTER — Telehealth: Payer: Self-pay | Admitting: Obstetrics and Gynecology

## 2014-08-20 MED ORDER — DEXTROSE 5 % IV SOLN
2.0000 g | INTRAVENOUS | Status: AC
  Administered 2014-08-21: 2 g via INTRAVENOUS
  Filled 2014-08-20: qty 2

## 2014-08-20 NOTE — Telephone Encounter (Signed)
Called patient. She states she was advised she needed to arrive at hospital by 730 but patient wanted to find out if she needed to be at the hospital at 730 or if she needed to plan to arrive earlier. Advised patient she needs to check in by 730 so plan to arrive to hospital, give time to park and find location so that she can check in by 730. Patient agreeable and verbalized understanding.   Routing to provider for final review. Patient agreeable to disposition. Will close encounter.

## 2014-08-20 NOTE — Telephone Encounter (Signed)
Patient is unclear on what time she needs to be at the hospital for surgery tomorrow? Patient says you can leave the time on her voicemail if she does not answer. Last seen 07/30/14.

## 2014-08-20 NOTE — H&P (Signed)
Nunzio Cobbs, MD at 07/30/2014  4:46 PM       Status: Signed        Expand All Collapse All   Patient ID: Teresa Kaiser, female   DOB: Aug 11, 1960, 54 y.o.   MRN: [id number] GYNECOLOGY  VISIT   HPI: 54 y.o.   Married  Caucasian  female    G1P1001 with Patient's last menstrual period was 10/14/2012 (approximate).   here to discuss surgery.  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    Worried about post op pain.      Asking about weight gain.   GYNECOLOGIC HISTORY: Patient's last menstrual period was 10/14/2012 (approximate). Contraception:postmenopausal Menopausal hormone therapy: n/a Last mammogram: 06-27-14 Diag. And U/S probable benign bilateral breast calcifications. Probably benign Rt. Breast cluster of cysts. Rec. Diag. With possible u/s in 6 mo.:[facility] Last pap smear: 06-21-14 HGSIL:Pos. HR HPV         OB History      Gravida  Para  Term  Preterm  AB  TAB  SAB  Ectopic  Multiple  Living     1  1  1              1              Patient Active Problem List     Diagnosis  Date Noted   .  Nausea  06/22/2013   .  Vomiting  06/22/2013       Past Medical History   Diagnosis  Date   .  Pap smear abnormality of cervix with HGSIL  06-21-14       :Pos HR HPV       Past Surgical History   Procedure  Laterality  Date   .  Fatty tumor removal    07/2005       --benign--under left breast-was actually on ribcage--done in [facility]   .  Dilation and curettage of uterus    03/2005       DUB--neg. for hyperplasia or dysplasia/Dr. Sena Slate       Current Outpatient Prescriptions   Medication  Sig  Dispense  Refill   .  Vitamin D, Ergocalciferol, (DRISDOL) 50000 UNITS CAPS capsule  Take 50,000 Units by mouth once a week.    0      No current facility-administered medications for this visit.      ALLERGIES: Clindamycin/lincomycin    Family  History   Problem  Relation  Age of Onset   .  Cancer  Mother  46       dec--?endometrial ca   .  Heart attack  Father  41       dec       History      Social History   .  Marital Status:  Married       Spouse Name:  N/A   .  Number of Children:  N/A   .  Years of Education:  N/A      Occupational History   .  Not on file.      Social History Main Topics   .  Smoking status:  Never Smoker    .  Smokeless tobacco:  Not on file   .  Alcohol Use:  No   .  Drug Use:  No   .  Sexual Activity:  Partners:  Male       Patent examiner Protection:  Post-menopausal      Other Topics  Concern   .  Not on file      Social History Narrative     ROS:  Pertinent items are noted in HPI.  PHYSICAL EXAMINATION:    BP 138/80 mmHg  Pulse 80  Ht 5' 3.5" (1.613 m)  Wt 164 lb 12.8 oz (74.753 kg)  BMI 28.73 kg/m2  LMP 10/14/2012 (Approximate)     General appearance: alert, cooperative and appears stated age Head: Normocephalic, without obvious abnormality, atraumatic Neck: no adenopathy, supple, symmetrical, trachea midline and thyroid normal to inspection and palpation Lungs: clear to auscultation bilaterally Heart: regular rate and rhythm Abdomen: soft, non-tender; bowel sounds normal; no masses,  no organomegaly Extremities: extremities normal, atraumatic, no cyanosis or edema Skin: Skin color, texture, turgor normal. No rashes or lesions Lymph nodes: Cervical, supraclavicular, and axillary nodes normal. No abnormal inguinal nodes palpated Neurologic: Grossly normal  Pelvic: External genitalia:  no lesions              Urethra:  normal appearing urethra with no masses, tenderness or lesions              Bartholins and Skenes: normal                  Vagina: normal appearing vagina with normal color and discharge, no lesions              Cervix: no lesions              Bimanual Exam:  Uterus:  normal size, contour, position, consistency, mobility, non-tender               Adnexa: normal adnexa and no mass, fullness, tenderness               Chaperone was present for exam.  ASSESSMENT  CIN III. Weight gain.    PLAN  CIN III discussed. Proceed with cold knife conization of cervix with fractional dilation and curettage.  Risks, benefits, and alternative reviewed with patient who wishes to proceed.   I discussed that scarring post op of the cervix with cold knife conization can occur that makes future evaluation more difficult.   Patient understands that cervical dysplasia can recur as the treatment does not remove the HPV from her tissues. Discussed surgical recovery expectations and management of post op pain with oral medication - Percocet and Motrin.   Will address weight gain after surgery.   Discussed referral to dietician.      An After Visit Summary was printed and given to the patient.  _25____ minutes face to face time of which over 50% was spent in counseling.

## 2014-08-21 ENCOUNTER — Ambulatory Visit (HOSPITAL_COMMUNITY): Payer: BLUE CROSS/BLUE SHIELD | Admitting: Anesthesiology

## 2014-08-21 ENCOUNTER — Ambulatory Visit (HOSPITAL_COMMUNITY)
Admission: RE | Admit: 2014-08-21 | Discharge: 2014-08-21 | Disposition: A | Discharge status: Home / Self Care | Payer: BLUE CROSS/BLUE SHIELD | Source: Ambulatory Visit | Attending: Obstetrics and Gynecology | Admitting: Obstetrics and Gynecology

## 2014-08-21 ENCOUNTER — Encounter (HOSPITAL_COMMUNITY)
Admission: RE | Disposition: A | Discharge status: Home / Self Care | Payer: Self-pay | Source: Ambulatory Visit | Attending: Obstetrics and Gynecology

## 2014-08-21 DIAGNOSIS — D069 Carcinoma in situ of cervix, unspecified: Secondary | ICD-10-CM

## 2014-08-21 DIAGNOSIS — N86 Erosion and ectropion of cervix uteri: Secondary | ICD-10-CM | POA: Insufficient documentation

## 2014-08-21 DIAGNOSIS — N812 Incomplete uterovaginal prolapse: Secondary | ICD-10-CM | POA: Insufficient documentation

## 2014-08-21 DIAGNOSIS — Z6828 Body mass index (BMI) 28.0-28.9, adult: Secondary | ICD-10-CM | POA: Diagnosis not present

## 2014-08-21 DIAGNOSIS — R635 Abnormal weight gain: Secondary | ICD-10-CM | POA: Insufficient documentation

## 2014-08-21 HISTORY — PX: DILATION AND CURETTAGE OF UTERUS: SHX78

## 2014-08-21 HISTORY — PX: CERVICAL CONIZATION W/BX: SHX1330

## 2014-08-21 LAB — CBC
HCT: 40 % (ref 36.0–46.0)
Hemoglobin: 13.3 g/dL (ref 12.0–15.0)
MCH: 27.5 pg (ref 26.0–34.0)
MCHC: 33.3 g/dL (ref 30.0–36.0)
MCV: 82.6 fL (ref 78.0–100.0)
Platelets: 265 10*3/uL (ref 150–400)
RBC: 4.84 MIL/uL (ref 3.87–5.11)
RDW: 13.1 % (ref 11.5–15.5)
WBC: 5.3 10*3/uL (ref 4.0–10.5)

## 2014-08-21 SURGERY — CONE BIOPSY, CERVIX
Anesthesia: General | Site: Vagina

## 2014-08-21 MED ORDER — FENTANYL CITRATE (PF) 100 MCG/2ML IJ SOLN
INTRAMUSCULAR | Status: DC | PRN
  Administered 2014-08-21 (×2): 50 ug via INTRAVENOUS

## 2014-08-21 MED ORDER — ACETIC ACID 5 % SOLN
Status: AC
  Filled 2014-08-21: qty 500

## 2014-08-21 MED ORDER — SCOPOLAMINE 1 MG/3DAYS TD PT72
MEDICATED_PATCH | TRANSDERMAL | Status: AC
  Administered 2014-08-21: 1.5 mg via TRANSDERMAL
  Filled 2014-08-21: qty 1

## 2014-08-21 MED ORDER — LIDOCAINE HCL (CARDIAC) 20 MG/ML IV SOLN
INTRAVENOUS | Status: AC
  Filled 2014-08-21: qty 5

## 2014-08-21 MED ORDER — FENTANYL CITRATE (PF) 100 MCG/2ML IJ SOLN
INTRAMUSCULAR | Status: AC
  Filled 2014-08-21: qty 2

## 2014-08-21 MED ORDER — LACTATED RINGERS IV SOLN: INTRAVENOUS | Status: DC

## 2014-08-21 MED ORDER — KETOROLAC TROMETHAMINE 30 MG/ML IJ SOLN
INTRAMUSCULAR | Status: AC
  Filled 2014-08-21: qty 1

## 2014-08-21 MED ORDER — ONDANSETRON HCL 4 MG/2ML IJ SOLN
INTRAMUSCULAR | Status: AC
  Filled 2014-08-21: qty 2

## 2014-08-21 MED ORDER — OXYCODONE-ACETAMINOPHEN 5-325 MG PO TABS: 1.0000 | ORAL_TABLET | ORAL | Status: DC | PRN

## 2014-08-21 MED ORDER — LIDOCAINE HCL (CARDIAC) 20 MG/ML IV SOLN
INTRAVENOUS | Status: DC | PRN
  Administered 2014-08-21: 40 mg via INTRAVENOUS

## 2014-08-21 MED ORDER — DEXAMETHASONE SODIUM PHOSPHATE 10 MG/ML IJ SOLN
INTRAMUSCULAR | Status: DC | PRN
  Administered 2014-08-21: 10 mg via INTRAVENOUS

## 2014-08-21 MED ORDER — MIDAZOLAM HCL 5 MG/5ML IJ SOLN
INTRAMUSCULAR | Status: DC | PRN
  Administered 2014-08-21: 2 mg via INTRAVENOUS

## 2014-08-21 MED ORDER — SCOPOLAMINE 1 MG/3DAYS TD PT72
1.0000 | MEDICATED_PATCH | Freq: Once | TRANSDERMAL | Status: DC
  Administered 2014-08-21: 1.5 mg via TRANSDERMAL

## 2014-08-21 MED ORDER — KETOROLAC TROMETHAMINE 30 MG/ML IJ SOLN
INTRAMUSCULAR | Status: DC | PRN
  Administered 2014-08-21: 30 mg via INTRAVENOUS

## 2014-08-21 MED ORDER — LIDOCAINE HCL 1 % IJ SOLN
INTRAMUSCULAR | Status: AC
  Filled 2014-08-21: qty 20

## 2014-08-21 MED ORDER — LIDOCAINE-EPINEPHRINE 1 %-1:100000 IJ SOLN
INTRAMUSCULAR | Status: AC
  Filled 2014-08-21: qty 1

## 2014-08-21 MED ORDER — IODINE STRONG (LUGOLS) 5 % PO SOLN
ORAL | Status: DC | PRN
  Administered 2014-08-21: 0.2 mL

## 2014-08-21 MED ORDER — PROPOFOL 10 MG/ML IV BOLUS
INTRAVENOUS | Status: AC
  Filled 2014-08-21: qty 20

## 2014-08-21 MED ORDER — FERRIC SUBSULFATE 259 MG/GM EX SOLN
CUTANEOUS | Status: AC
  Filled 2014-08-21: qty 8

## 2014-08-21 MED ORDER — OXYCODONE-ACETAMINOPHEN 5-325 MG PO TABS
ORAL_TABLET | ORAL | Status: AC
  Filled 2014-08-21: qty 1

## 2014-08-21 MED ORDER — OXYCODONE-ACETAMINOPHEN 5-325 MG PO TABS
1.0000 | ORAL_TABLET | Freq: Once | ORAL | Status: AC
Start: 2014-08-21 — End: 2014-08-21
  Administered 2014-08-21: 1 via ORAL

## 2014-08-21 MED ORDER — IODINE STRONG (LUGOLS) 5 % PO SOLN
ORAL | Status: AC
  Filled 2014-08-21: qty 1

## 2014-08-21 MED ORDER — FERRIC SUBSULFATE SOLN
Status: DC | PRN
  Administered 2014-08-21: 1

## 2014-08-21 MED ORDER — FENTANYL CITRATE (PF) 100 MCG/2ML IJ SOLN
25.0000 ug | INTRAMUSCULAR | Status: DC | PRN
  Administered 2014-08-21: 50 ug via INTRAVENOUS

## 2014-08-21 MED ORDER — DEXAMETHASONE SODIUM PHOSPHATE 10 MG/ML IJ SOLN
INTRAMUSCULAR | Status: AC
  Filled 2014-08-21: qty 1

## 2014-08-21 MED ORDER — MEPERIDINE HCL 25 MG/ML IJ SOLN: 6.2500 mg | INTRAMUSCULAR | Status: DC | PRN

## 2014-08-21 MED ORDER — PROMETHAZINE HCL 25 MG/ML IJ SOLN: 6.2500 mg | INTRAMUSCULAR | Status: DC | PRN

## 2014-08-21 MED ORDER — LIDOCAINE-EPINEPHRINE 1 %-1:100000 IJ SOLN
INTRAMUSCULAR | Status: DC | PRN
  Administered 2014-08-21: 15 mL

## 2014-08-21 MED ORDER — ONDANSETRON HCL 4 MG/2ML IJ SOLN
INTRAMUSCULAR | Status: DC | PRN
  Administered 2014-08-21: 4 mg via INTRAVENOUS

## 2014-08-21 MED ORDER — ACETIC ACID 4% SOLUTION
Status: DC | PRN
  Administered 2014-08-21: 1 via TOPICAL

## 2014-08-21 MED ORDER — PROPOFOL 10 MG/ML IV BOLUS
INTRAVENOUS | Status: DC | PRN
  Administered 2014-08-21: 130 mg via INTRAVENOUS

## 2014-08-21 MED ORDER — MIDAZOLAM HCL 2 MG/2ML IJ SOLN
INTRAMUSCULAR | Status: AC
  Filled 2014-08-21: qty 2

## 2014-08-21 MED ORDER — IBUPROFEN 800 MG PO TABS: 800.0000 mg | ORAL_TABLET | Freq: Three times a day (TID) | ORAL | Status: DC | PRN

## 2014-08-21 MED ORDER — LACTATED RINGERS IV SOLN
INTRAVENOUS | Status: DC
  Administered 2014-08-21 (×2): via INTRAVENOUS

## 2014-08-21 SURGICAL SUPPLY — 27 items
APPLICATOR COTTON TIP 6IN STRL (MISCELLANEOUS) IMPLANT
BLADE SURG 11 STRL SS (BLADE) ×2 IMPLANT
CATH ROBINSON RED A/P 16FR (CATHETERS) ×2 IMPLANT
CLOTH BEACON ORANGE TIMEOUT ST (SAFETY) ×2 IMPLANT
CONTAINER PREFILL 10% NBF 60ML (FORM) ×2 IMPLANT
COUNTER NEEDLE 1200 MAGNETIC (NEEDLE) ×2 IMPLANT
ELECT BALL LEEP 5MM RED (ELECTRODE) ×2 IMPLANT
ELECT REM PT RETURN 9FT ADLT (ELECTROSURGICAL)
ELECTRODE REM PT RTRN 9FT ADLT (ELECTROSURGICAL) IMPLANT
GLOVE BIO SURGEON STRL SZ 6.5 (GLOVE) ×2 IMPLANT
GOWN STRL REUS W/TWL LRG LVL3 (GOWN DISPOSABLE) ×4 IMPLANT
NS IRRIG 1000ML POUR BTL (IV SOLUTION) ×2 IMPLANT
PACK VAGINAL MINOR WOMEN LF (CUSTOM PROCEDURE TRAY) ×2 IMPLANT
PAD OB MATERNITY 4.3X12.25 (PERSONAL CARE ITEMS) ×2 IMPLANT
PAD PREP 24X48 CUFFED NSTRL (MISCELLANEOUS) ×2 IMPLANT
PENCIL BUTTON HOLSTER BLD 10FT (ELECTRODE) ×2 IMPLANT
SCOPETTES 8  STERILE (MISCELLANEOUS) ×3
SCOPETTES 8 STERILE (MISCELLANEOUS) ×3 IMPLANT
SPONGE SURGIFOAM ABS GEL 12-7 (HEMOSTASIS) IMPLANT
SUT CHROMIC 1 CT1 27 (SUTURE) ×8 IMPLANT
SUT SILK 2 0 SH (SUTURE) ×2 IMPLANT
SUT VIC AB 0 CT1 27 (SUTURE)
SUT VIC AB 0 CT1 27XBRD ANBCTR (SUTURE) IMPLANT
TOWEL OR 17X24 6PK STRL BLUE (TOWEL DISPOSABLE) ×4 IMPLANT
TUBING NON-CON 1/4 X 20 CONN (TUBING) ×2 IMPLANT
WATER STERILE IRR 1000ML POUR (IV SOLUTION) ×2 IMPLANT
YANKAUER SUCT BULB TIP NO VENT (SUCTIONS) ×2 IMPLANT

## 2014-08-21 NOTE — Discharge Instructions (Signed)
Conization of the Cervix, Care After °Refer to this sheet in the next few weeks. These instructions provide you with information on caring for yourself after your procedure. Your health care provider may also give you more specific instructions. Your treatment has been planned according to current medical practices but problems sometimes occur. Call your health care provider if you have any problems or questions after your procedure. °WHAT TO EXPECT AFTER THE PROCEDURE °After your procedure, it is typical to have the following sensations: °· If you had a general anesthetic, you may be groggy for 2-3 hours after the procedure. °· You may have cramps (similar to menstrual cramps) for about 1 week.   °· You may have a bloody discharge or light to moderate bleeding for 1-2 weeks.  The bleeding should not be heavy (for example, it should not soak 1 pad in less than 1 hour). °· You may have a black vaginal discharge that looks similar to coffee grounds. This is from the paste that was applied to the cervix to control bleeding. This is normal. °Recovery may take up to 3 weeks.  °HOME CARE INSTRUCTIONS  °· Arrange for someone to drive you home after the procedure. °· Only take medicines as directed by your health care provider. Do not take aspirin. It can cause bleeding.   °· Take showers for the first week. Do not take baths, swim, or use hot tubs until your health care provider says it is okay.   °· Do not douche, use tampons, or have sexual intercourse until your health care provider says it is okay.   °· Avoid strenuous activities, exercises, and heavy lifting for at least 7-14 days. °· You may resume your normal diet unless your health care provider advises you differently.     °· If you are constipated, you may: °¨ Take a mild laxative as directed by your health care provider.   °¨ Add fruit and bran to your diet.   °¨ Make sure to drink enough fluids to keep your urine clear or pale yellow. °· Keep follow-up  appointments with your health care provider. °SEEK MEDICAL CARE IF:  °· You develop a rash.   °· You are dizzy or lightheaded.   °· You feel nauseous.   °· You develop a bad smelling vaginal discharge. °SEEK IMMEDIATE MEDICAL CARE IF:  °· You have blood clots or bleeding that is heavier than a normal menstrual period (for example, soaking a pad in less than 1 hour) or you develop bright red bleeding.   °· You have a fever over 101°F (38.3°C) or persistent symptoms for more than 2-3 days.   °· You have a fever over 101°F (38.3°C) and your symptoms suddenly get worse. °· You have increasing cramps.   °· You faint.   °· You have pain when urinating. °· You have bloody urine.   °· You start vomiting.   °· Your pain is not relieved with your medicine.   °· Your have severe or worsening pain. °MAKE SURE YOU: °· Understand these instructions. °· Will watch your condition. °· Will get help right away if you are not doing well or get worse. °Document Released: 03/02/2005 Document Revised: 03/07/2013 Document Reviewed: 08/26/2012 °ExitCare® Patient Information ©2015 [employer]. This information is not intended to replace advice given to you by your health care provider. Make sure you discuss any questions you have with your health care provider. ° °

## 2014-08-21 NOTE — Anesthesia Procedure Notes (Signed)
Procedure Name: LMA Insertion Date/Time: 08/21/2014 8:44 AM Performed by: Barkley Boards L Pre-anesthesia Checklist: Patient identified, Patient being monitored, Emergency Drugs available and Suction available Patient Re-evaluated:Patient Re-evaluated prior to inductionOxygen Delivery Method: Circle system utilized Preoxygenation: Pre-oxygenation with 100% oxygen Intubation Type: IV induction Ventilation: Mask ventilation without difficulty LMA: LMA inserted LMA Size: 4.0 Number of attempts: 1 Placement Confirmation: positive ETCO2,  CO2 detector and breath sounds checked- equal and bilateral Tube secured with: Tape Dental Injury: Teeth and Oropharynx as per pre-operative assessment

## 2014-08-21 NOTE — Anesthesia Postprocedure Evaluation (Signed)
  Anesthesia Post-op Note  Patient: Teresa Kaiser  Procedure(s) Performed: Procedure(s) with comments: CONIZATION CERVIX WITH BIOPSY (N/A) - request 1.25 hours DILATATION AND CURETTAGE fractional  (N/A)  Patient Location: PACU  Anesthesia Type:General  Level of Consciousness: awake and alert   Airway and Oxygen Therapy: Patient Spontanous Breathing  Post-op Pain: mild  Post-op Assessment: Post-op Vital signs reviewed, Patient's Cardiovascular Status Stable, Respiratory Function Stable, Patent Airway and No signs of Nausea or vomiting  Post-op Vital Signs: Reviewed and stable  Last Vitals:  Filed Vitals:   08/21/14 0934  BP: 101/60  Pulse: 78  Temp: 36.3 C  Resp: 12    Complications: No apparent anesthesia complications

## 2014-08-21 NOTE — Brief Op Note (Signed)
08/21/2014  9:31 AM  PATIENT:  Teresa Kaiser  54 y.o. female  PRE-OPERATIVE DIAGNOSIS:  CIN III  POST-OPERATIVE DIAGNOSIS:  CIN III  PROCEDURE:  Procedure(s) with comments: CONIZATION CERVIX WITH BIOPSY (N/A) - request 1.25 hours DILATATION AND CURETTAGE fractional  (N/A)  SURGEON:  Surgeon(s) and Role:    * Josphine Laffey E Yisroel Ramming, MD - Primary  PHYSICIAN ASSISTANT: NA  ASSISTANTS: none   ANESTHESIA:   local and  LMA  EBL:  Total I/O In: 1000 [I.V.:1000] Out: 800 [Urine:800] EBl - 5 cc.  BLOOD ADMINISTERED:none  DRAINS: none   LOCAL MEDICATIONS USED:  Amount: 15 ml  Lidocaine with Epi 1:100,000  SPECIMEN:  Source of Specimen:  Cervical conization marked at 12:00, endocervical curettings, endometrial curettings  DISPOSITION OF SPECIMEN:  PATHOLOGY  COUNTS:  YES  TOURNIQUET:  * No tourniquets in log *  DICTATION: .Other Dictation: Dictation Number    PLAN OF CARE: Discharge to home after PACU  PATIENT DISPOSITION:  PACU - hemodynamically stable.   Delay start of Pharmacological VTE agent (>24hrs) due to surgical blood loss or risk of bleeding: not applicable

## 2014-08-21 NOTE — Transfer of Care (Signed)
Immediate Anesthesia Transfer of Care Note  Patient: Teresa Kaiser  Procedure(s) Performed: Procedure(s) with comments: CONIZATION CERVIX WITH BIOPSY (N/A) - request 1.25 hours DILATATION AND CURETTAGE fractional  (N/A)  Patient Location: PACU  Anesthesia Type:General  Level of Consciousness: sedated  Airway & Oxygen Therapy: Patient Spontanous Breathing and Patient connected to nasal cannula oxygen  Post-op Assessment: Report given to RN and Post -op Vital signs reviewed and stable  Post vital signs: stable  Last Vitals:  Filed Vitals:   08/21/14 0729  BP: 116/81  Pulse: 72  Temp: 37 C  Resp: 18    Complications: No apparent anesthesia complications

## 2014-08-21 NOTE — Progress Notes (Signed)
Update to History or Physical  No marked change in status since office preop visit.  Patinet examined.   OK to proceed with surgery.

## 2014-08-21 NOTE — Op Note (Signed)
[name], Kaiser            ACCOUNT NO.:  [id number]  MEDICAL RECORD NO.:  [id number]  LOCATION:  [facility]                          FACILITY:  [facility]  PHYSICIAN:  Teresa Kaiser, M.D.   DATE OF BIRTH:  Apr 30, 1960  DATE OF PROCEDURE:  08/21/2014 DATE OF DISCHARGE:                              OPERATIVE REPORT   PREOPERATIVE DIAGNOSIS:  CIN 3.  POSTOPERATIVE DIAGNOSIS:  CIN 3.  PROCEDURE:  Cold knife conization of the cervix, with fractional dilation and curettage.  SURGEON:  Teresa Kaiser, M.D.  ANESTHESIA:  LMA and local 1% lidocaine with epinephrine, 1:100,000.  IV FLUIDS:  1000 mL Ringer's lactate.  EBL:  5 mL.  URINE OUTPUT:  800 mL.  COMPLICATIONS:  None.  INDICATIONS FOR THE PROCEDURE:  The patient is a 54 year old gravida 1, para 1 Caucasian female with a last menstrual period in 2014, who presented for a routine examination and was noted to have a Pap smear showing CIN 2/3/CIS with a positive high-risk HPV status.  The patient had satisfactory colposcopy on July 13, 2014 and this showed CIN 3 on her ECC specimen and ectocervical biopsy.  The plan is now made to proceed with a cold knife conization of the cervix with fractional dilation and curettage after risks, benefits, and alternatives were reviewed.  FINDINGS:  Exam under anesthesia revealed first-degree uterine prolapse. There was no evidence of adnexal masses.  Acetic acid staining to the cervix documented thick and acetowhite lesions at the 10 o'clock position within the endocervix.  Please note that the patient had a very large cervical ectropion which covered almost the entire surface of the cervix.  There were no gross lesions noted of the cervix.  A very small amount of endocervical and endometrial curettings were obtained.  SPECIMENS:  Cervical conization, endocervical curettings, and endometrial curettings were sent to pathology all separately.   DESCRIPTION OF PROCEDURE:  The patient was  re-identified in the preoperative hold area.  She did receive cefotetan 2 g IV for antibiotic prophylaxis and she received TED hose and PAS stockings for DVT prophylaxis.  In the operating room, the patient was placed in the dorsal lithotomy position in [place] and an LMA anesthetic was then administered. The patient was sterilely prepped and catheterized of urine with a red rubber catheter.  She was sterilely draped.  An exam under anesthesia was performed.  A weighted speculum was placed inside the vagina and a Deaver retractor was placed anteriorly.  The cervix was stained first with acetic acid and the area of positive biopsy approximately 10 o'clock position was re- identified.  The cervix was then stained with Lugol solution, which outlined a squamocolumnar junction nicely.  #1 chromic was used to place a figure of eight suture at the 12, 3, 6, and 9 o'clock positions to help to control any bleeding.  The cervix was then circumferentially injected with 15 mL total of 1% lidocaine with epinephrine 1:100,000.  The scalpel on an angulated handle was then used to remove the cervical cone specimen without difficulty.  The specimen was marked at the 12 o'clock position with silk suture.  The cervix was then grasped anteriorly with a tenaculum.  The  uterus was sounded with the uterine sound.  The endocervix was curetted with a Kevorkian curette and the specimen was sent to Pathology.  The cervix was then dilated up to a #23 Pratt dilator.  A serrated curette was then introduced into the uterine cavity and was used to curette the endometrium throughout 360 degrees.  The endometrial curettings were sent to Pathology separately.  The bed of the cone was examined at this time.  It was cauterized with monopolar cautery which created good hemostasis.  Monsel's solution was then placed along the conization bed.  The sutures which had been held with hemostats were then cut to  a distance of approximately 1 cm.  Hemostasis was still good.  All of the vaginal instruments were removed.  The patient was cleansed of remaining Betadine.  She was awakened, she was taken out of the [place], she was escorted to the recovery room in stable condition.  There were no complications to the procedure. All needle, instrument, and sponge counts were correct.     Teresa Kaiser, M.D.     BES/MEDQ  D:  08/21/2014  T:  08/21/2014  Job:  [id number]

## 2014-08-22 ENCOUNTER — Encounter (HOSPITAL_COMMUNITY): Payer: Self-pay | Admitting: Obstetrics and Gynecology

## 2014-08-23 ENCOUNTER — Telehealth: Payer: Self-pay | Admitting: Emergency Medicine

## 2014-08-23 NOTE — Telephone Encounter (Signed)
Spoke with patient and results given. All questions answered. Advised will speak further with Dr. Quincy Simmonds at post op appointment and patient agreeable. She declines complaints at this time.  Confirmed appointment for post op and information given to patient regarding appointment. Routing to provider for final review. Patient agreeable to disposition. Will close encounter.   08 recall for 3 months placed.

## 2014-08-23 NOTE — Telephone Encounter (Signed)
-----   Message from Nunzio Cobbs, MD sent at 08/22/2014  8:10 PM EDT ----- Please inform patient of surgical pathology specimen showing CIN III - severe dysplasia.   No cancer was seen. Margins are negative for severe dysplasia.  Margins are positive for atypia on the outer surface of the cervix, not the inside margins. Patient had a very large treatment area so this does not surprise me.  The curettage of the cervix and the endometrium were negative.   I will discuss this result further with the patient at her post op visit. Please enter her in recall for 3 month pap and HR HPV testing so that we do this at a minimum.  Cc- Marisa Sprinkles

## 2014-09-06 ENCOUNTER — Ambulatory Visit (INDEPENDENT_AMBULATORY_CARE_PROVIDER_SITE_OTHER): Payer: BLUE CROSS/BLUE SHIELD | Admitting: Obstetrics and Gynecology

## 2014-09-06 ENCOUNTER — Encounter: Payer: Self-pay | Admitting: Obstetrics and Gynecology

## 2014-09-06 VITALS — BP 118/70 | HR 70 | Resp 16 | Ht 63.5 in | Wt 161.0 lb

## 2014-09-06 DIAGNOSIS — D069 Carcinoma in situ of cervix, unspecified: Secondary | ICD-10-CM

## 2014-09-06 NOTE — Progress Notes (Signed)
GYNECOLOGY  VISIT   HPI: 54 y.o.   Married  Caucasian  female   G1P1001 with Patient's last menstrual period was 10/14/2012 (approximate).   here for  Post op visit.  Did well after surgery.  No problems with heavy bleeding or pain.   Status post cervical conization with fractional dilation and curettage on 08/21/14. Surgical pathology specimen showing CIN III - severe dysplasia.  No cancer was seen. Margins are negative for severe dysplasia.  Margins are positive for atypia on the outer surface of the cervix, not the inside margins. Patient had a very large treatment area. The curettage of the cervix and the endometrium were negative.   Will plan for pap in 3 months.   GYNECOLOGIC HISTORY: Patient's last menstrual period was 10/14/2012 (approximate). Contraception:post menopausal Menopausal hormone therapy: none Last mammogram: 06-27-14 category b density, birads 3: probably benign  Last pap smear: 06-21-14 HGSIL HPV HR + colpo done 07-13-14 HGSIL, conization/d&c 08-21-14        OB History    Gravida Para Term Preterm AB TAB SAB Ectopic Multiple Living   1 1 1       1          Patient Active Problem List   Diagnosis Date Noted  . Nausea 06/22/2013  . Vomiting 06/22/2013    Past Medical History  Diagnosis Date  . 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 Fatty tumor removal  07/2005    --benign--under left breast-was actually on ribcage--done in [facility]  . Dilation and curettage of uterus  03/2005    DUB--neg. for hyperplasia or dysplasia/Dr. Sena Slate  . Cervical conization w/bx N/A 08/21/2014    Procedure: CONIZATION CERVIX WITH BIOPSY;  Margin positive with atypia - Surgeon: Nunzio Cobbs, MD;  Location: [facility];  Service: Gynecology;  Laterality: N/A;  request 1.25 hours  . Dilation and curettage of uterus N/A 08/21/2014    Procedure: DILATATION AND CURETTAGE  fractional ;  Surgeon: Nunzio Cobbs, MD;  Location: [facility];  Service: Gynecology;  Laterality: N/A;    No current outpatient prescriptions on file.   No current facility-administered medications for this visit.     ALLERGIES: Clindamycin/lincomycin  Family History  Problem Relation Age of Onset  . Cancer Mother 62    dec--?endometrial ca  . Heart attack Father 45    dec    History   Social History  . Marital Status: Married    Spouse Name: N/A  . Number of Children: N/A  . Years of Education: N/A   Occupational History  . Not on file.   Social History Main Topics  . Smoking status: Never Smoker   . Smokeless tobacco: Not on file  . Alcohol Use: No  . Drug Use: No  . Sexual Activity:    Partners: Male    Birth Control/ Protection: Post-menopausal   Other Topics Concern  . Not on file   Social History Narrative    ROS:  Pertinent items are noted in HPI.  PHYSICAL EXAMINATION:    BP 118/70 mmHg  Pulse 70  Resp 16  Ht 5' 3.5" (1.613 m)  Wt 161 lb (73.029 kg)  BMI 28.07 kg/m2  LMP 10/14/2012 (Approximate)    General appearance: alert, cooperative and appears stated age   Pelvic: External genitalia:  no lesions  Urethra:  normal appearing urethra with no masses, tenderness or lesions              Bartholins and Skenes: normal                 Vagina: normal appearing vagina with normal color and discharge, no lesions              Cervix: consistent with conization.  Exudate present.  Some suture present.              Bimanual Exam:  Uterus:  normal size, contour, position, consistency, mobility, non-tender              Adnexa: normal adnexa and no mass, fullness, tenderness             Chaperone was present for exam.  ASSESSMENT  Status post cold knife conization of cervix with dilation and curettage. CIN III with atypia on margin.   PLAN  Discussed pathology report.  Recheck in 4 weeks.  Will do pap in September  2016.  An After Visit Summary was printed and given to the patient.  ___15___ minutes face to face time of which over 50% was spent in counseling.

## 2014-09-07 ENCOUNTER — Telehealth: Payer: Self-pay | Admitting: Obstetrics and Gynecology

## 2014-09-07 NOTE — Telephone Encounter (Signed)
Phone call after hours from me to thank patient for the flowers she sent me in the office.  Patient sent flowers with a message thanking me for taking care of her.

## 2014-10-04 ENCOUNTER — Ambulatory Visit (INDEPENDENT_AMBULATORY_CARE_PROVIDER_SITE_OTHER): Payer: BLUE CROSS/BLUE SHIELD | Admitting: Obstetrics and Gynecology

## 2014-10-04 ENCOUNTER — Encounter: Payer: Self-pay | Admitting: Obstetrics and Gynecology

## 2014-10-04 VITALS — BP 118/78 | HR 80 | Resp 16 | Ht 63.5 in | Wt 163.8 lb

## 2014-10-04 DIAGNOSIS — N72 Inflammatory disease of cervix uteri: Secondary | ICD-10-CM

## 2014-10-04 MED ORDER — DOXYCYCLINE HYCLATE 100 MG PO CAPS: 100.0000 mg | ORAL_CAPSULE | Freq: Two times a day (BID) | ORAL | Status: DC

## 2014-10-04 NOTE — Progress Notes (Signed)
Patient ID: Teresa Kaiser, female   DOB: [birth year], 54 y.o.   MRN: [id number] GYNECOLOGY  VISIT   HPI: 54 y.o.   Married  Caucasian  female   G1P1001 with Patient's last menstrual period was 10/14/2012 (approximate).   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  DILATATION AND CURETTAGE fractional   No vaginal drainage or bleeding.  Has not resumed sexual activity.  Feels great.   Very appreciative of her care here.  Starting to take care of herself now!  Just diagnosed with low vit D - 10.   GYNECOLOGIC HISTORY: Patient's last menstrual period was 10/14/2012 (approximate). Contraception: Postmenopausal Menopausal hormone therapy: none Last mammogram: 06-27-14 Density Cat:B/Probably benign bilateral breast calcifications. Probably benign right breast cluster of cysts. No suspicious abnormality at the site of palpable concern within themedial left breast. Bilateral U/S probably benign bil.breast calcifications.  Probably benign Rt.br. Cluster of cysts.  No suspicious abnormality at the site of palpable concern within LT.br.  Bil Diag mmg with possible RT.br U/S in 6 mo.:[facility] Last pap smear: 06-21-14 HGSIL:Pos HR HPV        OB History    Gravida Para Term Preterm AB TAB SAB Ectopic Multiple Living   1 1 1       1          Patient Active Problem List   Diagnosis Date Noted  . Nausea 06/22/2013  . Vomiting 06/22/2013    Past Medical History  Diagnosis Date  . 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 Fatty tumor removal  07/2005    --benign--under left breast-was actually on ribcage--done in [facility]  . Dilation and curettage of uterus  03/2005    DUB--neg. for hyperplasia or dysplasia/Dr. Sena Slate  . Cervical conization w/bx N/A 08/21/2014    Procedure: CONIZATION CERVIX WITH  BIOPSY;  Margin positive with atypia - Surgeon: Nunzio Cobbs, MD;  Location: [facility];  Service: Gynecology;  Laterality: N/A;  request 1.25 hours  . Dilation and curettage of uterus N/A 08/21/2014    Procedure: DILATATION AND CURETTAGE fractional ;  Surgeon: Nunzio Cobbs, MD;  Location: [facility];  Service: Gynecology;  Laterality: N/A;    Current Outpatient Prescriptions  Medication Sig Dispense Refill  . Vitamin D, Ergocalciferol, (DRISDOL) 50000 UNITS CAPS capsule Take 1.25 Units by mouth once a week.  0   No current facility-administered medications for this visit.     ALLERGIES: Clindamycin/lincomycin  Family History  Problem Relation Age of Onset  . Cancer Mother 56    dec--?endometrial ca  . Heart attack Father 4    dec    History   Social History  . Marital Status: Married    Spouse Name: N/A  . Number of Children: N/A  . Years of Education: N/A   Occupational History  . Not on file.   Social History Main Topics  . Smoking status: Never Smoker   . Smokeless tobacco: Not on file  . Alcohol Use: No  . Drug Use: No  . Sexual Activity:    Partners: Male    Birth Control/ Protection: Post-menopausal   Other Topics Concern  . Not on file   Social History Narrative    ROS:  Pertinent items are noted in  HPI.  PHYSICAL EXAMINATION:    BP 118/78 mmHg  Pulse 80  Resp 16  Ht 5' 3.5" (1.613 m)  Wt 163 lb 12.8 oz (74.299 kg)  BMI 28.56 kg/m2  LMP 10/14/2012 (Approximate)    General appearance: alert, cooperative and appears stated age     Pelvic: External genitalia:  no lesions              Urethra:  normal appearing urethra with no masses, tenderness or lesions              Bartholins and Skenes: normal                 Vagina: normal appearing vagina with normal color and discharge, no lesions              Cervix:  surgical bed friable and with brown exudate and pale color.    No cervical motion tenderness.             Bimanual Exam:   Uterus:  normal size, contour, position, consistency, mobility, non-tender              Adnexa: normal adnexa and no mass, fullness, tenderness              Chaperone was present for exam.  ASSESSMENT  Cervicitis following cold knife conization of the cervix.  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  PLAN  Doxycycline 100 mg po bid for 10 days.  Follow up in 2 weeks.  No sexual activity.    Plan for potential first pap in September 2016.   An After Visit Summary was printed and given to the patient.  __15____ minutes face to face time of which over 50% was spent in counseling.

## 2014-10-17 ENCOUNTER — Encounter: Payer: Self-pay | Admitting: Obstetrics and Gynecology

## 2014-10-17 ENCOUNTER — Ambulatory Visit (INDEPENDENT_AMBULATORY_CARE_PROVIDER_SITE_OTHER): Payer: BLUE CROSS/BLUE SHIELD | Admitting: Obstetrics and Gynecology

## 2014-10-17 VITALS — BP 106/70 | HR 78 | Ht 63.5 in | Wt 164.8 lb

## 2014-10-17 DIAGNOSIS — D069 Carcinoma in situ of cervix, unspecified: Secondary | ICD-10-CM

## 2014-10-17 DIAGNOSIS — N72 Inflammatory disease of cervix uteri: Secondary | ICD-10-CM

## 2014-10-17 MED ORDER — ESTROGENS, CONJUGATED 0.625 MG/GM VA CREA: TOPICAL_CREAM | VAGINAL | Status: DC

## 2014-10-17 NOTE — Progress Notes (Signed)
GYNECOLOGY  VISIT   HPI: 54 y.o.   Married  Caucasian  female   G1P1001 with Patient's last menstrual period was 10/14/2012 (approximate).   here for cervicitis recheck following cold knife conization, fractional dilation and curettage for CIN III on August 21, 2014. Margins negative for HR dysplasia and positive for atypia on exocervix.   Took Doxycycline 100 mg po bid for 10 days.  No problems. No vaginal drainage.  Not sexually active since prior to surgery.   GYNECOLOGIC HISTORY: Patient's last menstrual period was 10/14/2012 (approximate). 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 Nausea 06/22/2013  . Vomiting 06/22/2013    Past Medical History  Diagnosis Date  . 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 Fatty tumor removal  07/2005    --benign--under left breast-was actually on ribcage--done in [facility]  . Dilation and curettage of uterus  03/2005    DUB--neg. for hyperplasia or dysplasia/Dr. Sena Slate  . Cervical conization w/bx N/A 08/21/2014    Procedure: CONIZATION CERVIX WITH BIOPSY;  Margin positive with atypia - Surgeon: Nunzio Cobbs, MD;  Location: [facility];  Service: Gynecology;  Laterality: N/A;  request 1.25 hours  . Dilation and curettage of uterus N/A 08/21/2014    Procedure: DILATATION AND CURETTAGE fractional ;  Surgeon: Nunzio Cobbs, MD;  Location: [facility];  Service: Gynecology;  Laterality: N/A;    Current Outpatient Prescriptions  Medication Sig  Dispense Refill  . Vitamin D, Ergocalciferol, (DRISDOL) 50000 UNITS CAPS capsule Take 1.25 Units by mouth once a week.  0   No current facility-administered medications for this visit.     ALLERGIES: Clindamycin/lincomycin  Family History  Problem Relation Age of Onset  . Cancer Mother 46    dec--?endometrial ca  . Heart attack Father 42    dec    History   Social History  . Marital Status: Married    Spouse Name: N/A  . Number of Children: N/A  . Years of Education: N/A   Occupational History  . Not on file.   Social History Main Topics  . Smoking status: Never Smoker   . Smokeless tobacco: Not on file  . Alcohol Use: No  . Drug Use: No  . Sexual Activity:    Partners: Male    Birth Control/ Protection: Post-menopausal   Other Topics Concern  . Not on file   Social History Narrative    ROS:  Pertinent items are noted in HPI.  PHYSICAL EXAMINATION:    BP 106/70 mmHg  Pulse 78  Ht 5' 3.5" (1.613 m)  Wt 164 lb 12.8 oz (74.753 kg)  BMI 28.73 kg/m2  LMP 10/14/2012 (Approximate)    General appearance: alert, cooperative and appears stated age    Pelvic: External genitalia:  no lesions  Urethra:  normal appearing urethra with no masses, tenderness or lesions              Bartholins and Skenes: normal                 Vagina: normal appearing vagina with normal color and discharge, no lesions              Cervix: Exudate and erythema of the cervix with brown tan appearance of the tissue at the cone bed.                Bimanual Exam:  Uterus:  normal size, contour, position, consistency, mobility, non-tender              Adnexa: normal adnexa and no mass, fullness, tenderness                Chaperone was present for exam.  ASSESSMENT  CIN III.  Cervicitis. Status post doxycycyline  Prolonged healing of cervix following conization.   PLAN  Counseled regarding healing process.  Will start Premarin cream 1/2 grma pv at hs for 2 weeks.  Instructed in use and risks of DVT, PE, MI, stroke, breast cancer. Recheck in 2 weeks and plan for colposcopy and biopsy.    An After Visit Summary was printed and given to the patient.  ___15___ minutes face to face time of which over 50% was spent in counseling.

## 2014-10-24 ENCOUNTER — Telehealth: Payer: Self-pay | Admitting: Obstetrics and Gynecology

## 2014-10-24 NOTE — Telephone Encounter (Signed)
Please call patient to have her reschedule.  She needs a recheck appointment with me.  I need to check her cervix as she is not healing from the cold knife conization.  I need to reassess.

## 2014-10-24 NOTE — Telephone Encounter (Signed)
Patient canceled her appointment for colpo on 10/1814 she also canceled her annual. She states she will call back to reschedule at a later time.

## 2014-10-25 NOTE — Telephone Encounter (Signed)
Call to patient, left message to call back to [facility].

## 2014-10-29 NOTE — Telephone Encounter (Signed)
Call to patient. Left message to call back.  

## 2014-11-01 ENCOUNTER — Ambulatory Visit: Payer: BLUE CROSS/BLUE SHIELD | Admitting: Obstetrics and Gynecology

## 2014-11-01 NOTE — Telephone Encounter (Signed)
Call to patient, left message to call back to [facility].

## 2014-11-05 ENCOUNTER — Encounter: Payer: Self-pay | Admitting: *Deleted

## 2014-11-05 NOTE — Telephone Encounter (Signed)
We need to send letter asking her to reschedule her appointment.   Thank you.

## 2014-11-05 NOTE — Telephone Encounter (Signed)
Patient has not returned calls. Please advise.

## 2014-11-06 ENCOUNTER — Ambulatory Visit: Payer: BLUE CROSS/BLUE SHIELD | Admitting: Gynecology

## 2014-11-07 ENCOUNTER — Encounter: Payer: Self-pay | Admitting: *Deleted

## 2014-11-07 NOTE — Telephone Encounter (Signed)
Reviewed with Dr Quincy Simmonds. Letter written. Will contact patient one more time and allow additional business day before mailing letter. Call to patient. Left message to call back. Left message very important that she call back regarding her follow-up. Ask for Gay Filler.

## 2014-11-09 NOTE — Telephone Encounter (Signed)
Patient will need 30 day letter sent.  Thank you.

## 2014-11-09 NOTE — Telephone Encounter (Signed)
No response from patient. How would you like to proceed?

## 2014-11-12 NOTE — Telephone Encounter (Signed)
Letter mailed certified and regular [place] mail. Encounter closed.  

## 2014-11-16 ENCOUNTER — Ambulatory Visit: Payer: BLUE CROSS/BLUE SHIELD | Admitting: Obstetrics and Gynecology

## 2014-11-27 ENCOUNTER — Telehealth: Payer: Self-pay | Admitting: *Deleted

## 2014-11-27 NOTE — Telephone Encounter (Signed)
Patient called to cancel her scheduled appointment with Dr. Fermin Schwab on 11/30/2014. Patient stated"I will be out of town for a job trip for up to two weeks". Patient declined to reschedule at this time but will reschedule once she returns.  The Referring Physician Dr. Carren Rang was notified.

## 2014-11-30 ENCOUNTER — Ambulatory Visit: Payer: BLUE CROSS/BLUE SHIELD | Admitting: Gynecology

## 2015-10-03 DIAGNOSIS — M5431 Sciatica, right side: Secondary | ICD-10-CM | POA: Diagnosis not present

## 2015-10-04 ENCOUNTER — Other Ambulatory Visit: Payer: Self-pay | Admitting: Family Medicine

## 2015-10-04 ENCOUNTER — Ambulatory Visit
Admission: RE | Admit: 2015-10-04 | Discharge: 2015-10-04 | Disposition: A | Discharge status: Home / Self Care | Payer: BLUE CROSS/BLUE SHIELD | Source: Ambulatory Visit | Attending: Family Medicine | Admitting: Family Medicine

## 2015-10-04 DIAGNOSIS — M47816 Spondylosis without myelopathy or radiculopathy, lumbar region: Secondary | ICD-10-CM | POA: Diagnosis not present

## 2015-10-04 DIAGNOSIS — M5431 Sciatica, right side: Secondary | ICD-10-CM

## 2015-10-23 ENCOUNTER — Ambulatory Visit: Payer: BLUE CROSS/BLUE SHIELD

## 2015-11-11 DIAGNOSIS — Z6830 Body mass index (BMI) 30.0-30.9, adult: Secondary | ICD-10-CM | POA: Diagnosis not present

## 2015-11-11 DIAGNOSIS — Z01419 Encounter for gynecological examination (general) (routine) without abnormal findings: Secondary | ICD-10-CM | POA: Diagnosis not present

## 2015-11-11 DIAGNOSIS — R87613 High grade squamous intraepithelial lesion on cytologic smear of cervix (HGSIL): Secondary | ICD-10-CM | POA: Diagnosis not present

## 2016-01-16 ENCOUNTER — Telehealth: Payer: Self-pay | Admitting: *Deleted

## 2016-01-16 NOTE — Telephone Encounter (Signed)
Notified referring MD office that patient has not contacted our office to reschedule her appointment, as patient stated that she would. Santiago Glad( New patient coordinator) will send patient a letter asking  the patient to please contact their office so her appointment may be rescheduled if needed.

## 2016-01-24 ENCOUNTER — Other Ambulatory Visit: Payer: Self-pay | Admitting: Obstetrics and Gynecology

## 2016-01-24 DIAGNOSIS — N6310 Unspecified lump in the right breast, unspecified quadrant: Secondary | ICD-10-CM

## 2016-01-30 ENCOUNTER — Ambulatory Visit
Admission: RE | Admit: 2016-01-30 | Discharge: 2016-01-30 | Disposition: A | Discharge status: Home / Self Care | Payer: BLUE CROSS/BLUE SHIELD | Source: Ambulatory Visit | Attending: Obstetrics and Gynecology | Admitting: Obstetrics and Gynecology

## 2016-01-30 DIAGNOSIS — N6310 Unspecified lump in the right breast, unspecified quadrant: Secondary | ICD-10-CM

## 2016-01-30 DIAGNOSIS — R921 Mammographic calcification found on diagnostic imaging of breast: Secondary | ICD-10-CM | POA: Diagnosis not present

## 2016-07-29 ENCOUNTER — Encounter: Payer: Self-pay | Admitting: Gynecology

## 2016-08-29 DIAGNOSIS — B354 Tinea corporis: Secondary | ICD-10-CM | POA: Diagnosis not present

## 2016-09-02 DIAGNOSIS — R21 Rash and other nonspecific skin eruption: Secondary | ICD-10-CM | POA: Diagnosis not present

## 2016-10-09 DIAGNOSIS — R233 Spontaneous ecchymoses: Secondary | ICD-10-CM | POA: Diagnosis not present

## 2016-11-13 DIAGNOSIS — Z01419 Encounter for gynecological examination (general) (routine) without abnormal findings: Secondary | ICD-10-CM | POA: Diagnosis not present

## 2016-11-13 DIAGNOSIS — R87613 High grade squamous intraepithelial lesion on cytologic smear of cervix (HGSIL): Secondary | ICD-10-CM | POA: Diagnosis not present

## 2016-11-13 DIAGNOSIS — Z6827 Body mass index (BMI) 27.0-27.9, adult: Secondary | ICD-10-CM | POA: Diagnosis not present

## 2017-03-11 DIAGNOSIS — H35433 Paving stone degeneration of retina, bilateral: Secondary | ICD-10-CM | POA: Diagnosis not present

## 2017-03-11 DIAGNOSIS — H35373 Puckering of macula, bilateral: Secondary | ICD-10-CM | POA: Diagnosis not present

## 2017-04-09 DIAGNOSIS — H43812 Vitreous degeneration, left eye: Secondary | ICD-10-CM | POA: Diagnosis not present

## 2017-11-18 DIAGNOSIS — R635 Abnormal weight gain: Secondary | ICD-10-CM | POA: Diagnosis not present

## 2017-11-18 DIAGNOSIS — Z13220 Encounter for screening for lipoid disorders: Secondary | ICD-10-CM | POA: Diagnosis not present

## 2017-11-18 DIAGNOSIS — Z1151 Encounter for screening for human papillomavirus (HPV): Secondary | ICD-10-CM | POA: Diagnosis not present

## 2017-11-18 DIAGNOSIS — Z6828 Body mass index (BMI) 28.0-28.9, adult: Secondary | ICD-10-CM | POA: Diagnosis not present

## 2017-11-18 DIAGNOSIS — R87613 High grade squamous intraepithelial lesion on cytologic smear of cervix (HGSIL): Secondary | ICD-10-CM | POA: Diagnosis not present

## 2017-11-18 DIAGNOSIS — Z01419 Encounter for gynecological examination (general) (routine) without abnormal findings: Secondary | ICD-10-CM | POA: Diagnosis not present

## 2018-01-13 DIAGNOSIS — R21 Rash and other nonspecific skin eruption: Secondary | ICD-10-CM | POA: Diagnosis not present

## 2018-02-24 DIAGNOSIS — N898 Other specified noninflammatory disorders of vagina: Secondary | ICD-10-CM | POA: Diagnosis not present

## 2018-02-24 DIAGNOSIS — R8290 Unspecified abnormal findings in urine: Secondary | ICD-10-CM | POA: Diagnosis not present

## 2018-03-14 DIAGNOSIS — N941 Unspecified dyspareunia: Secondary | ICD-10-CM | POA: Diagnosis not present

## 2018-03-14 DIAGNOSIS — D259 Leiomyoma of uterus, unspecified: Secondary | ICD-10-CM | POA: Diagnosis not present

## 2018-03-17 IMAGING — CR DG LUMBAR SPINE COMPLETE 4+V
5 series · 5 of 5 positions shown · non-contrast
Comparison: None.

CLINICAL DATA: Patient with right-sided lower back pain radiating
down to the right foot.

EXAM:
LUMBAR SPINE - COMPLETE 4+ VIEW

[t l-spine a.p.]
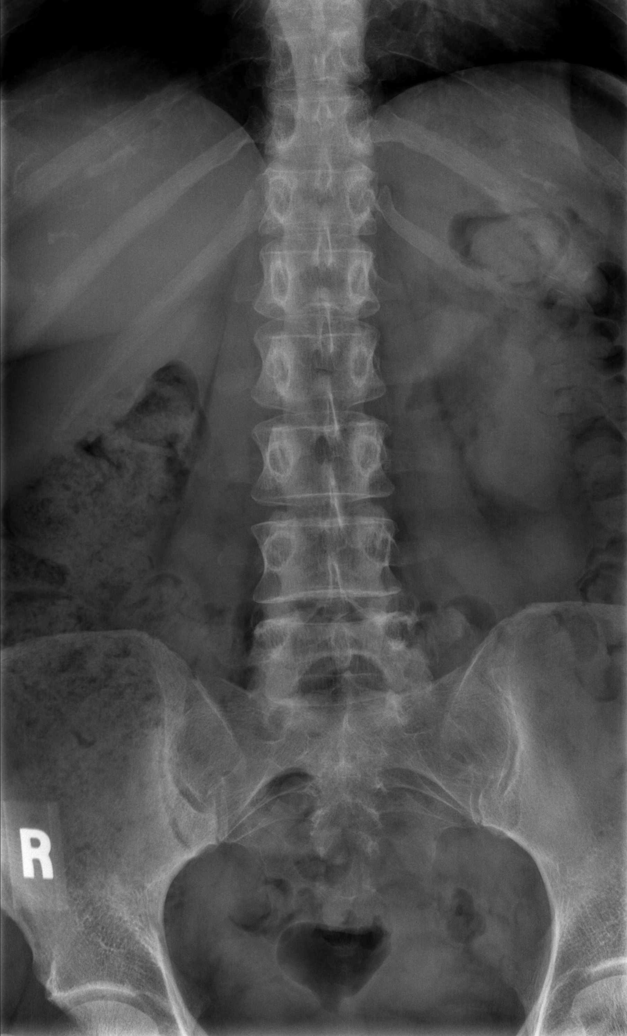

[t l-spine oblique exposure (1 of 2)]
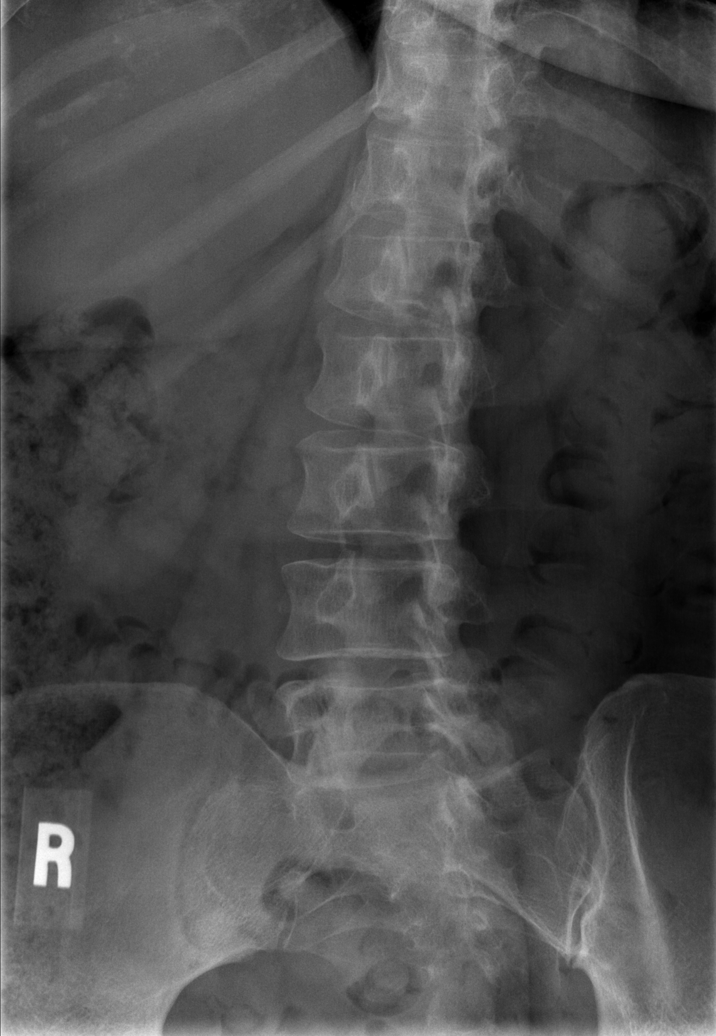

[t l-spine oblique exposure (2 of 2)]
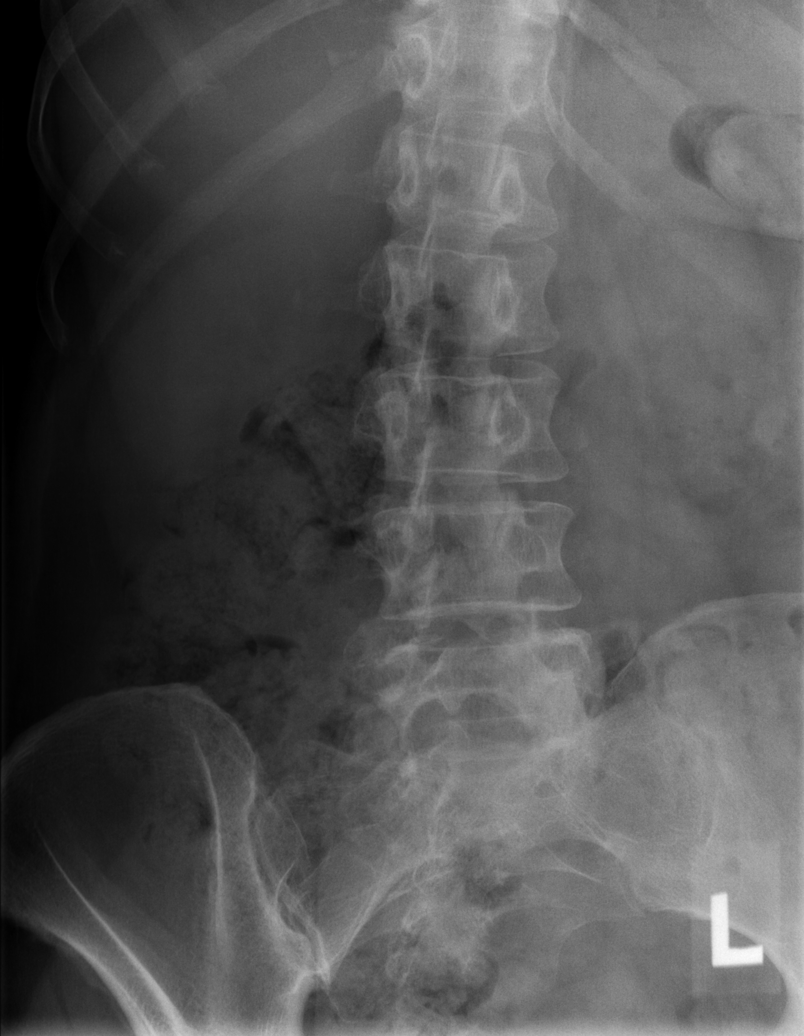

[t l-spine lat]
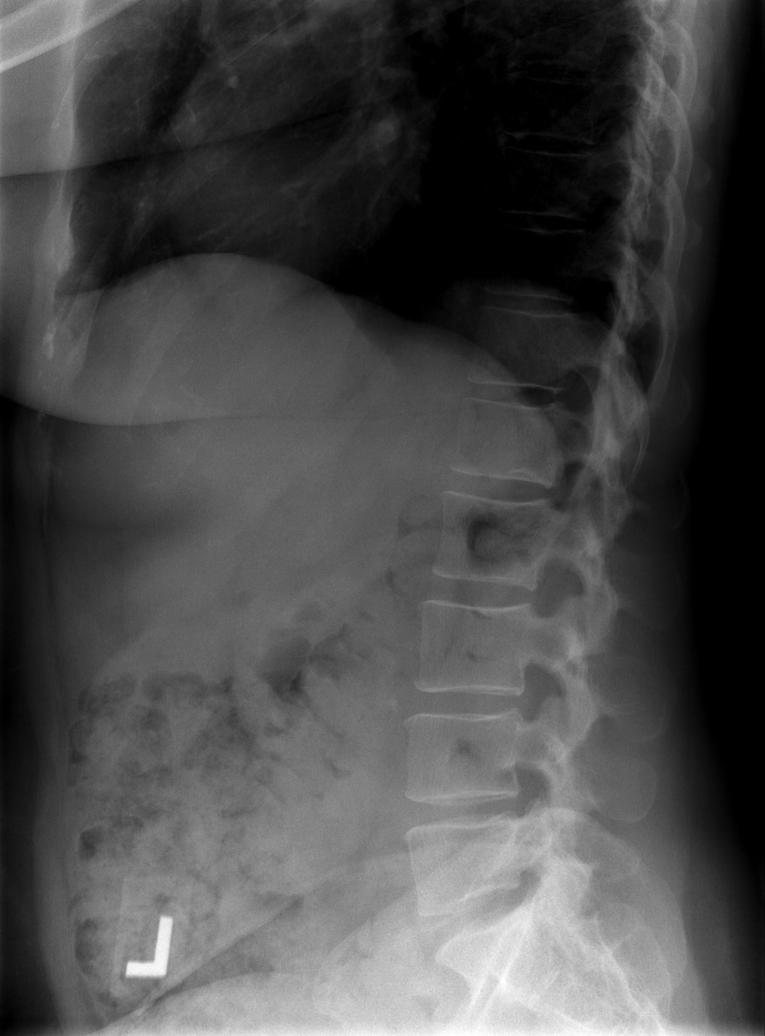

[t l-spine l5-s1 spot]
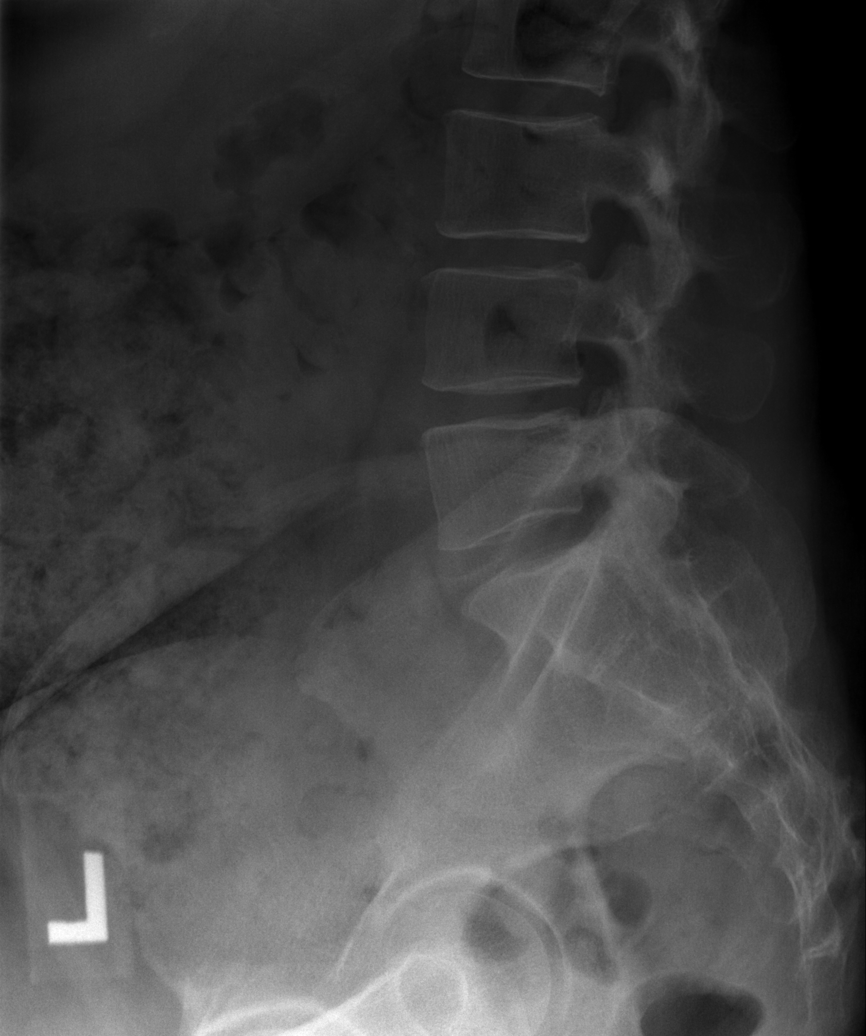

[5 of 5 positions shown; findings below may reference images not displayed]

FINDINGS: Normal anatomic alignment. No evidence for acute fracture or
dislocation. Preservation of the vertebral body and intervertebral
disc space heights. L5-S1 facet degenerative changes. Stool
throughout the colon. SI joints unremarkable.
IMPRESSION: Lower lumbar spine degenerative changes. No acute osseous
abnormality.

## 2018-03-29 DIAGNOSIS — N950 Postmenopausal bleeding: Secondary | ICD-10-CM | POA: Diagnosis not present

## 2018-03-29 DIAGNOSIS — Z8049 Family history of malignant neoplasm of other genital organs: Secondary | ICD-10-CM | POA: Diagnosis not present

## 2018-04-06 DIAGNOSIS — N950 Postmenopausal bleeding: Secondary | ICD-10-CM | POA: Diagnosis not present

## 2018-04-07 ENCOUNTER — Telehealth: Payer: Self-pay | Admitting: *Deleted

## 2018-04-07 NOTE — Telephone Encounter (Signed)
Attempted to contact the patient for a new patient appt. Unable to leave to leave a message, mailbox was full.

## 2018-04-08 ENCOUNTER — Telehealth: Payer: Self-pay | Admitting: *Deleted

## 2018-04-08 NOTE — Telephone Encounter (Signed)
Called, spoke with the patient and scheduled the appt for 1/31 at [place] [place] the patient the instructions for free valet and pelvic exam.

## 2018-04-11 ENCOUNTER — Telehealth: Payer: Self-pay | Admitting: *Deleted

## 2018-04-11 NOTE — Telephone Encounter (Signed)
Patient called regarding her appt and payment. She wanted to know how much she will have to pay for the biopsy. Explained to the patient that we may not do a biopsy, but her payment would probably be a co-pay per her insurance for a specialist

## 2018-04-11 NOTE — Progress Notes (Signed)
Updated allergy list.

## 2018-04-14 ENCOUNTER — Telehealth: Payer: Self-pay | Admitting: *Deleted

## 2018-04-14 NOTE — Telephone Encounter (Signed)
Called and rescheduled the patient from tomorrow morning to tomorrow afternoon

## 2018-04-15 ENCOUNTER — Encounter: Payer: Self-pay | Admitting: Gynecologic Oncology

## 2018-04-15 ENCOUNTER — Inpatient Hospital Stay: Payer: BLUE CROSS/BLUE SHIELD | Attending: Gynecologic Oncology | Admitting: Gynecologic Oncology

## 2018-04-15 VITALS — BP 157/87 | HR 110 | Temp 98.5°F | Resp 20 | Ht 64.0 in | Wt 168.9 lb

## 2018-04-15 DIAGNOSIS — N950 Postmenopausal bleeding: Secondary | ICD-10-CM | POA: Diagnosis not present

## 2018-04-15 DIAGNOSIS — N870 Mild cervical dysplasia: Secondary | ICD-10-CM | POA: Diagnosis not present

## 2018-04-15 NOTE — Patient Instructions (Signed)
Preparing for your Surgery  Plan for surgery on April 26, 2018 with Dr. Everitt Amber at [place] will be scheduled for a robotic assisted total hysterectomy, bilateral salpingo-oophorectomy, possible staging.   Pre-operative Testing -You will receive a phone call from presurgical testing at [facility] to arrange for a pre-operative testing appointment before your surgery.  This appointment normally occurs one to two weeks before your scheduled surgery.   -Bring your insurance card, copy of an advanced directive if applicable, medication list  -At that visit, you will be asked to sign a consent for a possible blood transfusion in case a transfusion becomes necessary during surgery.  The need for a blood transfusion is rare but having consent is a necessary part of your care.     -You should not be taking blood thinners or aspirin at least ten days prior to surgery unless instructed by your surgeon.  Day Before Surgery at [place] will be asked to take in a light diet the day before surgery.  Avoid carbonated beverages.  You will be advised to have nothing to eat or drink after midnight the evening before.    Eat a light diet the day before surgery.  Examples including soups, broths, toast, yogurt, mashed potatoes.  Things to avoid include carbonated beverages (fizzy beverages), raw fruits and raw vegetables, or beans.   If your bowels are filled with gas, your surgeon will have difficulty visualizing your pelvic organs which increases your surgical risks.  Your role in recovery Your role is to become active as soon as directed by your doctor, while still giving yourself time to heal.  Rest when you feel tired. You will be asked to do the following in order to speed your recovery:  - Cough and breathe deeply. This helps toclear and expand your lungs and can prevent pneumonia. You may be given a spirometer to practice deep breathing. A staff member will  show you how to use the spirometer. - Do mild physical activity. Walking or moving your legs help your circulation and body functions return to normal. A staff member will help you when you try to walk and will provide you with simple exercises. Do not try to get up or walk alone the first time. - Actively manage your pain. Managing your pain lets you move in comfort. We will ask you to rate your pain on a scale of zero to 10. It is your responsibility to tell your doctor or nurse where and how much you hurt so your pain can be treated.  Special Considerations -If you are diabetic, you may be placed on insulin after surgery to have closer control over your blood sugars to promote healing and recovery.  This does not mean that you will be discharged on insulin.  If applicable, your oral antidiabetics will be resumed when you are tolerating a solid diet.  -Your final pathology results from surgery should be available around one week after surgery and the results will be relayed to you when available.  -Dr. Lahoma Crocker is the Surgeon that assists your GYN Oncologist with surgery.  The next day after your surgery you will either see Dr. Denman George or Dr. Lahoma Crocker.  -FMLA forms can be faxed to [phone] and please allow 5-7 business days for completion.   Blood Transfusion Information WHAT IS A BLOOD TRANSFUSION? A transfusion is the replacement of blood or some of its parts. Blood is made up of multiple cells which provide  different functions.  Red blood cells carry oxygen and are used for blood loss replacement.  White blood cells fight against infection.  Platelets control bleeding.  Plasma helps clot blood.  Other blood products are available for specialized needs, such as hemophilia or other clotting disorders. BEFORE THE TRANSFUSION  Who gives blood for transfusions?   You may be able to donate blood to be used at a later date on yourself (autologous  donation).  Relatives can be asked to donate blood. This is generally not any safer than if you have received blood from a stranger. The same precautions are taken to ensure safety when a relative's blood is donated.  Healthy volunteers who are fully evaluated to make sure their blood is safe. This is blood bank blood. Transfusion therapy is the safest it has ever been in the practice of medicine. Before blood is taken from a donor, a complete history is taken to make sure that person has no history of diseases nor engages in risky social behavior (examples are intravenous drug use or sexual activity with multiple partners). The donor's travel history is screened to minimize risk of transmitting infections, such as malaria. The donated blood is tested for signs of infectious diseases, such as HIV and hepatitis. The blood is then tested to be sure it is compatible with you in order to minimize the chance of a transfusion reaction. If you or a relative donates blood, this is often done in anticipation of surgery and is not appropriate for emergency situations. It takes many days to process the donated blood. RISKS AND COMPLICATIONS Although transfusion therapy is very safe and saves many lives, the main dangers of transfusion include:   Getting an infectious disease.  Developing a transfusion reaction. This is an allergic reaction to something in the blood you were given. Every precaution is taken to prevent this. The decision to have a blood transfusion has been considered carefully by your caregiver before blood is given. Blood is not given unless the benefits outweigh the risks.

## 2018-04-15 NOTE — Progress Notes (Signed)
Consult Note: Gyn-Onc  Consult was requested by Dr. Ronita Hipps for the evaluation of Teresa Kaiser 58 y.o. female  CC:  Chief Complaint  Patient presents with  . PMB (postmenopausal bleeding)    Assessment/Plan:  [name]  is a 58 y.o.  year old with a stenotic cervix and abnormal postmenopausal uterine bleeding. Unable to adequately sample endometrium. Family history of endometrial cancer.  I am recommending diagnostic hysterectomy with bilateral salpingectomy, possible BSO, possible staging. Frozen section would be obtained intraoperatively. She will be a good candidate for minimally invasive approach with robotic assistance.  I discussed perioperative risks including  bleeding, infection, damage to internal organs (such as bladder,ureters, bowels), blood clot, reoperation and rehospitalization. I discussed the potential for false negative results on frozen section which may result in understaging.  She is interested in ovarian preservation if possible (benign pathology).    HPI: [name] is a 58 year old P1 who is seen in consultation at the request of Dr Ronita Hipps for abnormal uterine bleeding, and postmenopausal bleeding.  She reports a single episode of postmenopausal bleeding in December 2019.  She was seen by Dr. Rutherford Limerick who performed a transvaginal ultrasound on March 14, 2018 which revealed a uterus measuring 7.2 x 5 x 4.6 cm with an endometrial thickness of 13 mm with blood flow noted within.  There is possibly one submucosal fibroid measuring 3.4 cm.  The ovaries bilaterally were normal with no abnormalities.  A Pap smear was taken earlier that year in September 2019 which was cytologically normal and negative for high-risk HPV testing.  The patient expressed concern regarding the possibility of endometrial cancer and a biopsy was attempted.  Due to the patient's stenotic cervix this was unsuccessful even with ultrasound guidance and with use of Cytotec  vaginally.  One episode of sampling retrieved only scant benign epithelium with no endometrial cells seen.  In 2016 she had abnormal Pap smear which resulted in colposcopy and then subsequent cold knife conization.  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  She has a remote history of a tubal ligation and a D&C.  She has a family history significant for mother with a history of endometrial cancer.  She denies a family history of colon cancer.  She has had one prior vaginal delivery in the past.  She is otherwise healthy with a BMI of 29 kg/m.  Current Meds:  Outpatient Encounter Medications as of 04/15/2018  Medication Sig  . [DISCONTINUED] conjugated estrogens (PREMARIN) vaginal cream Use 1/2 g vaginally every night at bed time for the first 2 weeks.  The use 1/2 gram vaginally twice a week. (Patient not taking: Reported on 04/15/2018)  . [DISCONTINUED] Vitamin D, Ergocalciferol, (DRISDOL) 50000 UNITS CAPS capsule Take 1.25 Units by mouth once a week.   No facility-administered encounter medications on file as of 04/15/2018.     Allergy:  Allergies  Allergen Reactions  . Latex     Patient doesn't recall being allergic to latex.  . Clindamycin/Lincomycin Rash    Social Hx:   Social History   Socioeconomic History  . Marital status: Married    Spouse name: Not on file  . Number of children: Not on file  . Years of education: Not on file  . Highest education level: Not on file  Occupational History  . Not on file  Social Needs  . Financial resource strain: Not on file  .  Food insecurity:    Worry: Not on file    Inability: Not on file  . Transportation needs:    Medical: Not on file    Non-medical: Not on file  Tobacco Use  . Smoking status: Never Smoker  . Smokeless tobacco: Never Used  Substance and Sexual Activity  . Alcohol use: No    Alcohol/week: 0.0 standard drinks  .  Drug use: No  . Sexual activity: Yes    Partners: Male    Birth control/protection: Post-menopausal  Lifestyle  . Physical activity:    Days per week: Not on file    Minutes per session: Not on file  . Stress: Not on file  Relationships  . Social connections:    Talks on phone: Not on file    Gets together: Not on file    Attends religious service: Not on file    Active member of club or organization: Not on file    Attends meetings of clubs or organizations: Not on file    Relationship status: Not on file  . Intimate partner violence:    Fear of current or ex partner: Not on file    Emotionally abused: Not on file    Physically abused: Not on file    Forced sexual activity: Not on file  Other Topics Concern  . Not on file  Social History Narrative  . Not on file    Past Surgical Hx:  Past Surgical History:  Procedure Laterality Date  . CERVICAL CONIZATION W/BX N/A 08/21/2014   Procedure: CONIZATION CERVIX WITH BIOPSY;  Margin positive with atypia - Surgeon: Nunzio Cobbs, MD;  Location: [facility];  Service: Gynecology;  Laterality: N/A;  request 1.25 hours  . DILATION AND CURETTAGE OF UTERUS  03/2005   DUB--neg. for hyperplasia or dysplasia/Dr. Sena Slate  . DILATION AND CURETTAGE OF UTERUS N/A 08/21/2014   Procedure: DILATATION AND CURETTAGE fractional ;  Surgeon: Nunzio Cobbs, MD;  Location: [facility];  Service: Gynecology;  Laterality: N/A;  . fatty tumor removal  07/2005   --benign--under left breast-was actually on ribcage--done in [place]    Past Medical Hx:  Past Medical History:  Diagnosis Date  . Pap smear abnormality of cervix with HGSIL 06-21-14   :Pos HR HPV    Past Gynecological History:  SVD x 1, hx of CIN 3 Patient's last menstrual period was 10/14/2012 (approximate).  Family Hx:  Family History  Problem Relation Age of Onset  . Cancer Mother 60       dec--?endometrial ca  . Heart attack Father 28       dec     Review of Systems:  Constitutional  Feels well,    ENT Normal appearing ears and nares bilaterally Skin/Breast  No rash, sores, jaundice, itching, dryness Cardiovascular  No chest pain, shortness of breath, or edema  Pulmonary  No cough or wheeze.  Gastro Intestinal  No nausea, vomitting, or diarrhoea. No bright red blood per rectum, no abdominal pain, change in bowel movement, or constipation.  Genito Urinary  No frequency, urgency, dysuria, + bleeding Musculo Skeletal  No myalgia, arthralgia, joint swelling or pain  Neurologic  No weakness, numbness, change in gait,  Psychology  No depression, anxiety, insomnia.   Vitals:  Blood pressure (!) 157/87, pulse (!) 110, temperature 98.5 F (36.9 C), temperature source Oral, resp. rate 20, height 5\' 4"  (1.626 m), weight 168 lb 14.4 oz (76.6 kg), last menstrual period 10/14/2012,  SpO2 100 %.  Physical Exam: WD in NAD Neck  Supple NROM, without any enlargements.  Lymph Node Survey No cervical supraclavicular or inguinal adenopathy Cardiovascular  Pulse normal rate, regularity and rhythm. S1 and S2 normal.  Lungs  Clear to auscultation bilateraly, without wheezes/crackles/rhonchi. Good air movement.  Skin  No rash/lesions/breakdown  Psychiatry  Alert and oriented to person, place, and time  Abdomen  Normoactive bowel sounds, abdomen soft, non-tender and slightly overweight without evidence of hernia.  Back No CVA tenderness Genito Urinary  Vulva/vagina: Normal external female genitalia.  No lesions. No discharge or bleeding.  Bladder/urethra:  No lesions or masses, well supported bladder  Vagina: normal  Cervix: flush with vagina, os visible on left fornix.   Uterus:  Small, mobile, no parametrial involvement or nodularity.  Adnexa: no palpable masses. Rectal  deferred Extremities  No bilateral cyanosis, clubbing or edema.  Procedure Note:  Preop Dx: endometrial cancer Postop Dx: same Procedure: endometrial  biopsy Surgeon: Dorann Ou, MD EBL: minimal Specimens: endometrial biopsy Complications: none Procedure Details: Patient provided verbal consent and verbal timeout was performed.  The cervix was visualized with a speculum.  The cervix was grasped with a tenaculum.  An Allis finder was gently used to probe the endocervical canal.  The endometrial Pipelle was inserted carefully into the endometrial cavity to a depth of 7 cm.  2 passes of the endometrial cavity took place for scant amount of tissue.  The speculum was removed.  No bleeding was noted.  The patient tolerated the procedure well.  The specimen was sent for histopathology.    [name], MD  04/15/2018, 4:01 PM

## 2018-04-15 NOTE — H&P (View-Only) (Signed)
Consult Note: Gyn-Onc  Consult was requested by Dr. Ronita Hipps for the evaluation of Teresa Kaiser 58 y.o. female  CC:  Chief Complaint  Patient presents with  . PMB (postmenopausal bleeding)    Assessment/Plan:  Teresa Kaiser  is a 58 y.o.  year old with a stenotic cervix and abnormal postmenopausal uterine bleeding. Unable to adequately sample endometrium. Family history of endometrial cancer.  I am recommending diagnostic hysterectomy with bilateral salpingectomy, possible BSO, possible staging. Frozen section would be obtained intraoperatively. She will be a good candidate for minimally invasive approach with robotic assistance.  I discussed perioperative risks including  bleeding, infection, damage to internal organs (such as bladder,ureters, bowels), blood clot, reoperation and rehospitalization. I discussed the potential for false negative results on frozen section which may result in understaging.  She is interested in ovarian preservation if possible (benign pathology).    HPI: Teresa Kaiser is a 58 year old P1 who is seen in consultation at the request of Dr Ronita Hipps for abnormal uterine bleeding, and postmenopausal bleeding.  She reports a single episode of postmenopausal bleeding in December 2019.  She was seen by Dr. Rutherford Limerick who performed a transvaginal ultrasound on March 14, 2018 which revealed a uterus measuring 7.2 x 5 x 4.6 cm with an endometrial thickness of 13 mm with blood flow noted within.  There is possibly one submucosal fibroid measuring 3.4 cm.  The ovaries bilaterally were normal with no abnormalities.  A Pap smear was taken earlier that year in September 2019 which was cytologically normal and negative for high-risk HPV testing.  The patient expressed concern regarding the possibility of endometrial cancer and a biopsy was attempted.  Due to the patient's stenotic cervix this was unsuccessful even with ultrasound guidance and with use of Cytotec  vaginally.  One episode of sampling retrieved only scant benign epithelium with no endometrial cells seen.  In 2016 she had abnormal Pap smear which resulted in colposcopy and then subsequent cold knife conization.  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  She has a remote history of a tubal ligation and a D&C.  She has a family history significant for mother with a history of endometrial cancer.  She denies a family history of colon cancer.  She has had one prior vaginal delivery in the past.  She is otherwise healthy with a BMI of 29 kg/m.  Current Meds:  Outpatient Encounter Medications as of 04/15/2018  Medication Sig  . [DISCONTINUED] conjugated estrogens (PREMARIN) vaginal cream Use 1/2 g vaginally every night at bed time for the first 2 weeks.  The use 1/2 gram vaginally twice a week. (Patient not taking: Reported on 04/15/2018)  . [DISCONTINUED] Vitamin D, Ergocalciferol, (DRISDOL) 50000 UNITS CAPS capsule Take 1.25 Units by mouth once a week.   No facility-administered encounter medications on file as of 04/15/2018.     Allergy:  Allergies  Allergen Reactions  . Latex     Patient doesn't recall being allergic to latex.  . Clindamycin/Lincomycin Rash    Social Hx:   Social History   Socioeconomic History  . Marital status: Married    Spouse name: Not on file  . Number of children: Not on file  . Years of education: Not on file  . Highest education level: Not on file  Occupational History  . Not on file  Social Needs  . Financial resource strain: Not on file  .  Food insecurity:    Worry: Not on file    Inability: Not on file  . Transportation needs:    Medical: Not on file    Non-medical: Not on file  Tobacco Use  . Smoking status: Never Smoker  . Smokeless tobacco: Never Used  Substance and Sexual Activity  . Alcohol use: No    Alcohol/week: 0.0 standard drinks  .  Drug use: No  . Sexual activity: Yes    Partners: Male    Birth control/protection: Post-menopausal  Lifestyle  . Physical activity:    Days per week: Not on file    Minutes per session: Not on file  . Stress: Not on file  Relationships  . Social connections:    Talks on phone: Not on file    Gets together: Not on file    Attends religious service: Not on file    Active member of club or organization: Not on file    Attends meetings of clubs or organizations: Not on file    Relationship status: Not on file  . Intimate partner violence:    Fear of current or ex partner: Not on file    Emotionally abused: Not on file    Physically abused: Not on file    Forced sexual activity: Not on file  Other Topics Concern  . Not on file  Social History Narrative  . Not on file    Past Surgical Hx:  Past Surgical History:  Procedure Laterality Date  . CERVICAL CONIZATION W/BX N/A 08/21/2014   Procedure: CONIZATION CERVIX WITH BIOPSY;  Margin positive with atypia - Surgeon: Nunzio Cobbs, MD;  Location: [facility];  Service: Gynecology;  Laterality: N/A;  request 1.25 hours  . DILATION AND CURETTAGE OF UTERUS  03/2005   DUB--neg. for hyperplasia or dysplasia/Dr. Sena Slate  . DILATION AND CURETTAGE OF UTERUS N/A 08/21/2014   Procedure: DILATATION AND CURETTAGE fractional ;  Surgeon: Nunzio Cobbs, MD;  Location: [facility];  Service: Gynecology;  Laterality: N/A;  . fatty tumor removal  07/2005   --benign--under left breast-was actually on ribcage--done in [place]    Past Medical Hx:  Past Medical History:  Diagnosis Date  . Pap smear abnormality of cervix with HGSIL 06-21-14   :Pos HR HPV    Past Gynecological History:  SVD x 1, hx of CIN 3 Patient's last menstrual period was 10/14/2012 (approximate).  Family Hx:  Family History  Problem Relation Age of Onset  . Cancer Mother 77       dec--?endometrial ca  . Heart attack Father 69       dec     Review of Systems:  Constitutional  Feels well,    ENT Normal appearing ears and nares bilaterally Skin/Breast  No rash, sores, jaundice, itching, dryness Cardiovascular  No chest pain, shortness of breath, or edema  Pulmonary  No cough or wheeze.  Gastro Intestinal  No nausea, vomitting, or diarrhoea. No bright red blood per rectum, no abdominal pain, change in bowel movement, or constipation.  Genito Urinary  No frequency, urgency, dysuria, + bleeding Musculo Skeletal  No myalgia, arthralgia, joint swelling or pain  Neurologic  No weakness, numbness, change in gait,  Psychology  No depression, anxiety, insomnia.   Vitals:  Blood pressure (!) 157/87, pulse (!) 110, temperature 98.5 F (36.9 C), temperature source Oral, resp. rate 20, height 5\' 4"  (1.626 m), weight 168 lb 14.4 oz (76.6 kg), last menstrual period 10/14/2012,  SpO2 100 %.  Physical Exam: WD in NAD Neck  Supple NROM, without any enlargements.  Lymph Node Survey No cervical supraclavicular or inguinal adenopathy Cardiovascular  Pulse normal rate, regularity and rhythm. S1 and S2 normal.  Lungs  Clear to auscultation bilateraly, without wheezes/crackles/rhonchi. Good air movement.  Skin  No rash/lesions/breakdown  Psychiatry  Alert and oriented to person, place, and time  Abdomen  Normoactive bowel sounds, abdomen soft, non-tender and slightly overweight without evidence of hernia.  Back No CVA tenderness Genito Urinary  Vulva/vagina: Normal external female genitalia.  No lesions. No discharge or bleeding.  Bladder/urethra:  No lesions or masses, well supported bladder  Vagina: normal  Cervix: flush with vagina, os visible on left fornix.   Uterus:  Small, mobile, no parametrial involvement or nodularity.  Adnexa: no palpable masses. Rectal  deferred Extremities  No bilateral cyanosis, clubbing or edema.  Procedure Note:  Preop Dx: endometrial cancer Postop Dx: same Procedure: endometrial  biopsy Surgeon: Dorann Ou, MD EBL: minimal Specimens: endometrial biopsy Complications: none Procedure Details: Patient provided verbal consent and verbal timeout was performed.  The cervix was visualized with a speculum.  The cervix was grasped with a tenaculum.  An Allis finder was gently used to probe the endocervical canal.  The endometrial Pipelle was inserted carefully into the endometrial cavity to a depth of 7 cm.  2 passes of the endometrial cavity took place for scant amount of tissue.  The speculum was removed.  No bleeding was noted.  The patient tolerated the procedure well.  The specimen was sent for histopathology.    [name], MD  04/15/2018, 4:01 PM

## 2018-04-18 ENCOUNTER — Other Ambulatory Visit: Payer: Self-pay | Admitting: Gynecologic Oncology

## 2018-04-18 DIAGNOSIS — N939 Abnormal uterine and vaginal bleeding, unspecified: Secondary | ICD-10-CM

## 2018-04-21 ENCOUNTER — Telehealth: Payer: Self-pay

## 2018-04-21 NOTE — Telephone Encounter (Signed)
LM for patient to call back to the office to discuss the results of the biopsy results.

## 2018-04-21 NOTE — Patient Instructions (Signed)
Teresa Kaiser  04/21/2018   Your procedure is scheduled on: 04-26-2018   Report to [facility] Main  Entrance    Report to admitting at 5:30AM    Call this number if you have problems the morning of surgery [phone]      Remember: Eat a light diet the day before surgery.  Examples including soups, broths, toast, yogurt, mashed potatoes.  Things to avoid include carbonated beverages (fizzy beverages), raw fruits and raw vegetables, or beans. If your bowels are filled with gas, your surgeon will have difficulty visualizing your pelvic organs which increases your surgical risks.   NO SOLID FOOD AFTER MIDNIGHT THE NIGHT PRIOR TO SURGERY. NOTHING BY MOUTH EXCEPT CLEAR LIQUIDS UNTIL 3 HOURS PRIOR TO [place] SURGERY. PLEASE FINISH ENSURE DRINK PER SURGEON ORDER 3 HOURS PRIOR TO SCHEDULED SURGERY TIME WHICH NEEDS TO BE COMPLETED AT ____4:30AM______. BRUSH YOUR TEETH MORNING OF SURGERY AND RINSE YOUR MOUTH OUT, NO CHEWING GUM CANDY OR MINTS.      CLEAR LIQUID DIET   Foods Allowed                                                                     Foods Excluded  Coffee and tea, regular and decaf                             liquids that you cannot  Plain Jell-O in any flavor                                             see through such as: Fruit ices (not with fruit pulp)                                     milk, soups, orange juice  Iced Popsicles                                    All solid food Carbonated beverages, regular and diet                                    Cranberry, grape and apple juices Sports drinks like Gatorade Lightly seasoned clear broth or consume(fat free) Sugar, honey syrup  Sample Menu Breakfast                                Lunch                                     Supper Cranberry juice                    Beef broth  Chicken broth Jell-O                                     Grape juice                            Apple juice Coffee or tea                        Jell-O                                      Popsicle                                                Coffee or tea                        Coffee or tea  _____________________________________________________________________       Take these medicines the morning of surgery with A SIP OF WATER: NONE                                You may not have any metal on your body including hair pins and              piercings  Do not wear jewelry, make-up, lotions, powders or perfumes, deodorant             Do not wear nail polish.  Do not shave  48 hours prior to surgery.              Men may shave face and neck.   Do not bring valuables to the hospital. [place].  Contacts, dentures or bridgework may not be worn into surgery.  Leave suitcase in the car. After surgery it may be brought to your room.     Patients discharged the day of surgery will not be allowed to drive home. IF YOU ARE HAVING SURGERY AND GOING HOME THE SAME DAY, YOU MUST HAVE AN ADULT TO DRIVE YOU HOME AND BE WITH YOU FOR 24 HOURS. YOU MAY GO HOME BY TAXI OR UBER OR ORTHERWISE, BUT AN ADULT MUST ACCOMPANY YOU HOME AND STAY WITH YOU FOR 24 HOURS.  Name and phone number of your driver:  Special Instructions: N/A              Please read over the following fact sheets you were given: _____________________________________________________________________             [facility] - Preparing for Surgery Before surgery, you can play an important role.  Because skin is not sterile, your skin needs to be as free of germs as possible.  You can reduce the number of germs on your skin by washing with CHG (chlorahexidine gluconate) soap before surgery.  CHG is an antiseptic cleaner which kills germs and bonds with the skin to continue killing germs even after washing. Please DO NOT use if you have an allergy  to CHG or antibacterial  soaps.  If your skin becomes reddened/irritated stop using the CHG and inform your nurse when you arrive at Short Stay. Do not shave (including legs and underarms) for at least 48 hours prior to the first CHG shower.  You may shave your face/neck. Please follow these instructions carefully:  1.  Shower with CHG Soap the night before surgery and the  morning of Surgery.  2.  If you choose to wash your hair, wash your hair first as usual with your  normal  shampoo.  3.  After you shampoo, rinse your hair and body thoroughly to remove the  shampoo.                           4.  Use CHG as you would any other liquid soap.  You can apply chg directly  to the skin and wash                       Gently with a scrungie or clean washcloth.  5.  Apply the CHG Soap to your body ONLY FROM THE NECK DOWN.   Do not use on face/ open                           Wound or open sores. Avoid contact with eyes, ears mouth and genitals (private parts).                       Wash face,  Genitals (private parts) with your normal soap.             6.  Wash thoroughly, paying special attention to the area where your surgery  will be performed.  7.  Thoroughly rinse your body with warm water from the neck down.  8.  DO NOT shower/wash with your normal soap after using and rinsing off  the CHG Soap.                9.  Pat yourself dry with a clean towel.            10.  Wear clean pajamas.            11.  Place clean sheets on your bed the night of your first shower and do not  sleep with pets. Day of Surgery : Do not apply any lotions/deodorants the morning of surgery.  Please wear clean clothes to the hospital/surgery center.  FAILURE TO FOLLOW THESE INSTRUCTIONS MAY RESULT IN THE CANCELLATION OF YOUR SURGERY PATIENT SIGNATURE_________________________________  NURSE SIGNATURE__________________________________  ________________________________________________________________________   Teresa Kaiser  An  incentive spirometer is a tool that can help keep your lungs clear and active. This tool measures how well you are filling your lungs with each breath. Taking long deep breaths may help reverse or decrease the chance of developing breathing (pulmonary) problems (especially infection) following:  A long period of time when you are unable to move or be active. BEFORE THE PROCEDURE   If the spirometer includes an indicator to show your best effort, your nurse or respiratory therapist will set it to a desired goal.  If possible, sit up straight or lean slightly forward. Try not to slouch.  Hold the incentive spirometer in an upright position. INSTRUCTIONS FOR USE  1. Sit on the edge of your bed if possible, or sit up as far as  you can in bed or on a chair. 2. Hold the incentive spirometer in an upright position. 3. Breathe out normally. 4. Place the mouthpiece in your mouth and seal your lips tightly around it. 5. Breathe in slowly and as deeply as possible, raising the piston or the ball toward the top of the column. 6. Hold your breath for 3-5 seconds or for as long as possible. Allow the piston or ball to fall to the bottom of the column. 7. Remove the mouthpiece from your mouth and breathe out normally. 8. Rest for a few seconds and repeat Steps 1 through 7 at least 10 times every 1-2 hours when you are awake. Take your time and take a few normal breaths between deep breaths. 9. The spirometer may include an indicator to show your best effort. Use the indicator as a goal to work toward during each repetition. 10. After each set of 10 deep breaths, practice coughing to be sure your lungs are clear. If you have an incision (the cut made at the time of surgery), support your incision when coughing by placing a pillow or rolled up towels firmly against it. Once you are able to get out of bed, walk around indoors and cough well. You may stop using the incentive spirometer when instructed by your  caregiver.  RISKS AND COMPLICATIONS  Take your time so you do not get dizzy or light-headed.  If you are in pain, you may need to take or ask for pain medication before doing incentive spirometry. It is harder to take a deep breath if you are having pain. AFTER USE  Rest and breathe slowly and easily.  It can be helpful to keep track of a log of your progress. Your caregiver can provide you with a simple table to help with this. If you are using the spirometer at home, follow these instructions: [place] IF:   You are having difficultly using the spirometer.  You have trouble using the spirometer as often as instructed.  Your pain medication is not giving enough relief while using the spirometer.  You develop fever of 100.5 F (38.1 C) or higher. SEEK IMMEDIATE MEDICAL CARE IF:   You cough up bloody sputum that had not been present before.  You develop fever of 102 F (38.9 C) or greater.  You develop worsening pain at or near the incision site. MAKE SURE YOU:   Understand these instructions.  Will watch your condition.  Will get help right away if you are not doing well or get worse. Document Released: 07/13/2006 Document Revised: 05/25/2011 Document Reviewed: 09/13/2006 Exit[employer] Patient Information 2014 [employer], [place].   ________________________________________________________________________  WHAT IS A BLOOD TRANSFUSION? Blood Transfusion Information  A transfusion is the replacement of blood or some of its parts. Blood is made up of multiple cells which provide different functions.  Red blood cells carry oxygen and are used for blood loss replacement.  White blood cells fight against infection.  Platelets control bleeding.  Plasma helps clot blood.  Other blood products are available for specialized needs, such as hemophilia or other clotting disorders. BEFORE THE TRANSFUSION  Who gives blood for transfusions?   Healthy volunteers who are fully  evaluated to make sure their blood is safe. This is blood bank blood. Transfusion therapy is the safest it has ever been in the practice of medicine. Before blood is taken from a donor, a complete history is taken to make sure that person has no history of diseases  nor engages in risky social behavior (examples are intravenous drug use or sexual activity with multiple partners). The donor's travel history is screened to minimize risk of transmitting infections, such as malaria. The donated blood is tested for signs of infectious diseases, such as HIV and hepatitis. The blood is then tested to be sure it is compatible with you in order to minimize the chance of a transfusion reaction. If you or a relative donates blood, this is often done in anticipation of surgery and is not appropriate for emergency situations. It takes many days to process the donated blood. RISKS AND COMPLICATIONS Although transfusion therapy is very safe and saves many lives, the main dangers of transfusion include:   Getting an infectious disease.  Developing a transfusion reaction. This is an allergic reaction to something in the blood you were given. Every precaution is taken to prevent this. The decision to have a blood transfusion has been considered carefully by your caregiver before blood is given. Blood is not given unless the benefits outweigh the risks. AFTER THE TRANSFUSION  Right after receiving a blood transfusion, you will usually feel much better and more energetic. This is especially true if your red blood cells have gotten low (anemic). The transfusion raises the level of the red blood cells which carry oxygen, and this usually causes an energy increase.  The nurse administering the transfusion will monitor you carefully for complications. HOME CARE INSTRUCTIONS  No special instructions are needed after a transfusion. You may find your energy is better. Speak with your caregiver about any limitations on activity  for underlying diseases you may have. SEEK MEDICAL CARE IF:   Your condition is not improving after your transfusion.  You develop redness or irritation at the intravenous (IV) site. SEEK IMMEDIATE MEDICAL CARE IF:  Any of the following symptoms occur over the next 12 hours:  Shaking chills.  You have a temperature by mouth above 102 F (38.9 C), not controlled by medicine.  Chest, back, or muscle pain.  People around you feel you are not acting correctly or are confused.  Shortness of breath or difficulty breathing.  Dizziness and fainting.  You get a rash or develop hives.  You have a decrease in urine output.  Your urine turns a dark color or changes to pink, red, or brown. Any of the following symptoms occur over the next 10 days:  You have a temperature by mouth above 102 F (38.9 C), not controlled by medicine.  Shortness of breath.  Weakness after normal activity.  The white part of the eye turns yellow (jaundice).  You have a decrease in the amount of urine or are urinating less often.  Your urine turns a dark color or changes to pink, red, or brown. Document Released: 02/28/2000 Document Revised: 05/25/2011 Document Reviewed: 10/17/2007 [facility] Patient Information 2014 [place], [place].  _______________________________________________________________________

## 2018-04-22 ENCOUNTER — Encounter (HOSPITAL_COMMUNITY)
Admission: RE | Admit: 2018-04-22 | Discharge: 2018-04-22 | Disposition: A | Discharge status: Home / Self Care | Payer: BLUE CROSS/BLUE SHIELD | Source: Ambulatory Visit | Attending: Gynecologic Oncology | Admitting: Gynecologic Oncology

## 2018-04-22 ENCOUNTER — Telehealth: Payer: Self-pay

## 2018-04-22 ENCOUNTER — Encounter (HOSPITAL_COMMUNITY): Payer: Self-pay

## 2018-04-22 ENCOUNTER — Other Ambulatory Visit: Payer: Self-pay

## 2018-04-22 DIAGNOSIS — Z01812 Encounter for preprocedural laboratory examination: Secondary | ICD-10-CM | POA: Diagnosis not present

## 2018-04-22 HISTORY — DX: Abnormal uterine and vaginal bleeding, unspecified: N93.9

## 2018-04-22 HISTORY — DX: Malignant (primary) neoplasm, unspecified: C80.1

## 2018-04-22 LAB — URINALYSIS, ROUTINE W REFLEX MICROSCOPIC
Bilirubin Urine: NEGATIVE
Glucose, UA: NEGATIVE mg/dL
Hgb urine dipstick: NEGATIVE
Ketones, ur: NEGATIVE mg/dL
Leukocytes, UA: NEGATIVE
Nitrite: NEGATIVE
Protein, ur: NEGATIVE mg/dL
Specific Gravity, Urine: 1.002 — ABNORMAL LOW (ref 1.005–1.030)
pH: 7 (ref 5.0–8.0)

## 2018-04-22 LAB — COMPREHENSIVE METABOLIC PANEL
ALK PHOS: 50 U/L (ref 38–126)
ALT: 18 U/L (ref 0–44)
AST: 17 U/L (ref 15–41)
Albumin: 4.4 g/dL (ref 3.5–5.0)
Anion gap: 7 (ref 5–15)
BUN: 13 mg/dL (ref 6–20)
CALCIUM: 9.2 mg/dL (ref 8.9–10.3)
CO2: 26 mmol/L (ref 22–32)
Chloride: 109 mmol/L (ref 98–111)
Creatinine, Ser: 0.64 mg/dL (ref 0.44–1.00)
GFR calc Af Amer: >60 mL/min (ref >60)
GFR calc non Af Amer: >60 mL/min (ref >60)
Glucose, Bld: 111 mg/dL — ABNORMAL HIGH (ref 70–99)
Potassium: 4.5 mmol/L (ref 3.5–5.1)
Sodium: 142 mmol/L (ref 135–145)
Total Bilirubin: 0.5 mg/dL (ref 0.3–1.2)
Total Protein: 7.6 g/dL (ref 6.5–8.1)

## 2018-04-22 LAB — CBC
HCT: 41.6 % (ref 36.0–46.0)
Hemoglobin: 13.3 g/dL (ref 12.0–15.0)
MCH: 28.1 pg (ref 26.0–34.0)
MCHC: 32 g/dL (ref 30.0–36.0)
MCV: 87.9 fL (ref 80.0–100.0)
Platelets: 312 10*3/uL (ref 150–400)
RBC: 4.73 MIL/uL (ref 3.87–5.11)
RDW: 12.6 % (ref 11.5–15.5)
WBC: 5.6 10*3/uL (ref 4.0–10.5)
nRBC: 0 % (ref 0.0–0.2)

## 2018-04-22 LAB — ABO/RH: ABO/RH(D): A NEG

## 2018-04-22 NOTE — Telephone Encounter (Signed)
Outgoing call to patient regarding surgical pathology report per Joylene John NP "no endometrial tissue was obtained on biopsy so proceed with surgery as planned"- pt voiced understanding.  Reports she just had her pre-op appt today.  No other needs per pt at this time.

## 2018-04-26 ENCOUNTER — Ambulatory Visit (HOSPITAL_COMMUNITY)
Admission: RE | Admit: 2018-04-26 | Discharge: 2018-04-26 | Disposition: A | Discharge status: Home / Self Care | Payer: BLUE CROSS/BLUE SHIELD | Attending: Gynecologic Oncology | Admitting: Gynecologic Oncology

## 2018-04-26 ENCOUNTER — Encounter (HOSPITAL_COMMUNITY)
Admission: RE | Disposition: A | Discharge status: Home / Self Care | Payer: Self-pay | Source: Home / Self Care | Attending: Gynecologic Oncology

## 2018-04-26 ENCOUNTER — Ambulatory Visit (HOSPITAL_COMMUNITY): Payer: BLUE CROSS/BLUE SHIELD | Admitting: Certified Registered Nurse Anesthetist

## 2018-04-26 ENCOUNTER — Encounter (HOSPITAL_COMMUNITY): Payer: Self-pay

## 2018-04-26 ENCOUNTER — Ambulatory Visit (HOSPITAL_COMMUNITY): Payer: BLUE CROSS/BLUE SHIELD | Admitting: Physician Assistant

## 2018-04-26 DIAGNOSIS — D259 Leiomyoma of uterus, unspecified: Secondary | ICD-10-CM | POA: Diagnosis not present

## 2018-04-26 DIAGNOSIS — N938 Other specified abnormal uterine and vaginal bleeding: Secondary | ICD-10-CM | POA: Diagnosis not present

## 2018-04-26 DIAGNOSIS — N950 Postmenopausal bleeding: Secondary | ICD-10-CM | POA: Insufficient documentation

## 2018-04-26 DIAGNOSIS — Z881 Allergy status to other antibiotic agents status: Secondary | ICD-10-CM | POA: Diagnosis not present

## 2018-04-26 DIAGNOSIS — N870 Mild cervical dysplasia: Secondary | ICD-10-CM

## 2018-04-26 DIAGNOSIS — N888 Other specified noninflammatory disorders of cervix uteri: Secondary | ICD-10-CM

## 2018-04-26 DIAGNOSIS — N939 Abnormal uterine and vaginal bleeding, unspecified: Secondary | ICD-10-CM

## 2018-04-26 DIAGNOSIS — N882 Stricture and stenosis of cervix uteri: Secondary | ICD-10-CM | POA: Diagnosis not present

## 2018-04-26 DIAGNOSIS — Z8049 Family history of malignant neoplasm of other genital organs: Secondary | ICD-10-CM | POA: Diagnosis not present

## 2018-04-26 DIAGNOSIS — N958 Other specified menopausal and perimenopausal disorders: Secondary | ICD-10-CM | POA: Diagnosis not present

## 2018-04-26 HISTORY — PX: ROBOTIC ASSISTED TOTAL HYSTERECTOMY WITH BILATERAL SALPINGO OOPHERECTOMY: SHX6086

## 2018-04-26 LAB — TYPE AND SCREEN
ABO/RH(D): A NEG
Antibody Screen: NEGATIVE

## 2018-04-26 SURGERY — HYSTERECTOMY, TOTAL, ROBOT-ASSISTED, LAPAROSCOPIC, WITH BILATERAL SALPINGO-OOPHORECTOMY
Anesthesia: General | Site: Abdomen | Laterality: Bilateral

## 2018-04-26 MED ORDER — OXYCODONE-ACETAMINOPHEN 5-325 MG PO TABS: 1.0000 | ORAL_TABLET | ORAL | 0 refills | Status: DC | PRN

## 2018-04-26 MED ORDER — KETAMINE HCL 10 MG/ML IJ SOLN
INTRAMUSCULAR | Status: DC | PRN
  Administered 2018-04-26: 40 mg via INTRAVENOUS

## 2018-04-26 MED ORDER — LACTATED RINGERS IV SOLN
INTRAVENOUS | Status: DC
  Administered 2018-04-26 (×2): via INTRAVENOUS

## 2018-04-26 MED ORDER — PROPOFOL 10 MG/ML IV BOLUS
INTRAVENOUS | Status: AC
  Filled 2018-04-26: qty 20

## 2018-04-26 MED ORDER — GABAPENTIN 300 MG PO CAPS
ORAL_CAPSULE | ORAL | Status: AC
  Administered 2018-04-26: 300 mg
  Filled 2018-04-26: qty 1

## 2018-04-26 MED ORDER — ACETAMINOPHEN 325 MG PO TABS: 650.0000 mg | ORAL_TABLET | ORAL | Status: DC | PRN

## 2018-04-26 MED ORDER — LIDOCAINE 2% (20 MG/ML) 5 ML SYRINGE
INTRAMUSCULAR | Status: DC | PRN
  Administered 2018-04-26: 1.5 mg/kg/h via INTRAVENOUS

## 2018-04-26 MED ORDER — LIDOCAINE 2% (20 MG/ML) 5 ML SYRINGE
INTRAMUSCULAR | Status: DC | PRN
  Administered 2018-04-26: 80 mg via INTRAVENOUS

## 2018-04-26 MED ORDER — LIDOCAINE 2% (20 MG/ML) 5 ML SYRINGE
INTRAMUSCULAR | Status: AC
  Filled 2018-04-26: qty 5

## 2018-04-26 MED ORDER — MIDAZOLAM HCL 5 MG/5ML IJ SOLN
INTRAMUSCULAR | Status: DC | PRN
  Administered 2018-04-26: 2 mg via INTRAVENOUS

## 2018-04-26 MED ORDER — GABAPENTIN 300 MG PO CAPS: 300.0000 mg | ORAL_CAPSULE | ORAL | Status: DC

## 2018-04-26 MED ORDER — ROCURONIUM BROMIDE 100 MG/10ML IV SOLN
INTRAVENOUS | Status: AC
  Filled 2018-04-26: qty 1

## 2018-04-26 MED ORDER — FENTANYL CITRATE (PF) 250 MCG/5ML IJ SOLN
INTRAMUSCULAR | Status: AC
  Filled 2018-04-26: qty 5

## 2018-04-26 MED ORDER — ACETAMINOPHEN 650 MG RE SUPP
650.0000 mg | RECTAL | Status: DC | PRN
  Filled 2018-04-26: qty 1

## 2018-04-26 MED ORDER — OXYCODONE HCL 5 MG/5ML PO SOLN
5.0000 mg | Freq: Once | ORAL | Status: DC | PRN
  Filled 2018-04-26: qty 5

## 2018-04-26 MED ORDER — ROCURONIUM BROMIDE 100 MG/10ML IV SOLN
INTRAVENOUS | Status: DC | PRN
  Administered 2018-04-26: 50 mg via INTRAVENOUS
  Administered 2018-04-26: 10 mg via INTRAVENOUS

## 2018-04-26 MED ORDER — OXYCODONE HCL 5 MG PO TABS: 5.0000 mg | ORAL_TABLET | Freq: Once | ORAL | Status: DC | PRN

## 2018-04-26 MED ORDER — ONDANSETRON HCL 4 MG/2ML IJ SOLN
4.0000 mg | Freq: Four times a day (QID) | INTRAMUSCULAR | Status: AC | PRN
  Administered 2018-04-26: 4 mg via INTRAVENOUS

## 2018-04-26 MED ORDER — MORPHINE SULFATE (PF) 4 MG/ML IV SOLN: 2.0000 mg | INTRAVENOUS | Status: DC | PRN

## 2018-04-26 MED ORDER — CELECOXIB 200 MG PO CAPS
400.0000 mg | ORAL_CAPSULE | ORAL | Status: AC
  Administered 2018-04-26: 400 mg via ORAL

## 2018-04-26 MED ORDER — SODIUM CHLORIDE 0.9% FLUSH: 3.0000 mL | Freq: Two times a day (BID) | INTRAVENOUS | Status: DC

## 2018-04-26 MED ORDER — ACETAMINOPHEN 500 MG PO TABS
ORAL_TABLET | ORAL | Status: AC
  Filled 2018-04-26: qty 2

## 2018-04-26 MED ORDER — SODIUM CHLORIDE 0.9 % IV SOLN: 250.0000 mL | INTRAVENOUS | Status: DC | PRN

## 2018-04-26 MED ORDER — FENTANYL CITRATE (PF) 250 MCG/5ML IJ SOLN
INTRAMUSCULAR | Status: DC | PRN
  Administered 2018-04-26: 100 ug via INTRAVENOUS
  Administered 2018-04-26: 50 ug via INTRAVENOUS

## 2018-04-26 MED ORDER — SCOPOLAMINE 1 MG/3DAYS TD PT72
1.0000 | MEDICATED_PATCH | TRANSDERMAL | Status: DC
  Administered 2018-04-26: 1.5 mg via TRANSDERMAL

## 2018-04-26 MED ORDER — ONDANSETRON HCL 4 MG/2ML IJ SOLN
INTRAMUSCULAR | Status: DC | PRN
  Administered 2018-04-26 (×2): 4 mg via INTRAVENOUS

## 2018-04-26 MED ORDER — DEXAMETHASONE SODIUM PHOSPHATE 4 MG/ML IJ SOLN
4.0000 mg | INTRAMUSCULAR | Status: AC
  Administered 2018-04-26: 8 mg via INTRAVENOUS

## 2018-04-26 MED ORDER — SUGAMMADEX SODIUM 200 MG/2ML IV SOLN
INTRAVENOUS | Status: DC | PRN
  Administered 2018-04-26: 200 mg via INTRAVENOUS

## 2018-04-26 MED ORDER — PROPOFOL 10 MG/ML IV BOLUS
INTRAVENOUS | Status: DC | PRN
  Administered 2018-04-26: 160 mg via INTRAVENOUS
  Administered 2018-04-26: 40 mg via INTRAVENOUS

## 2018-04-26 MED ORDER — ACETAMINOPHEN 500 MG PO TABS
1000.0000 mg | ORAL_TABLET | ORAL | Status: AC
  Administered 2018-04-26: 1000 mg via ORAL

## 2018-04-26 MED ORDER — CELECOXIB 200 MG PO CAPS
ORAL_CAPSULE | ORAL | Status: AC
  Filled 2018-04-26: qty 2

## 2018-04-26 MED ORDER — SCOPOLAMINE 1 MG/3DAYS TD PT72
MEDICATED_PATCH | TRANSDERMAL | Status: AC
  Filled 2018-04-26: qty 1

## 2018-04-26 MED ORDER — OXYCODONE HCL 5 MG PO TABS: 5.0000 mg | ORAL_TABLET | ORAL | Status: DC | PRN

## 2018-04-26 MED ORDER — STERILE WATER FOR IRRIGATION IR SOLN
Status: DC | PRN
  Administered 2018-04-26: 1000 mL

## 2018-04-26 MED ORDER — FENTANYL CITRATE (PF) 100 MCG/2ML IJ SOLN
INTRAMUSCULAR | Status: AC
  Filled 2018-04-26: qty 2

## 2018-04-26 MED ORDER — CEFAZOLIN SODIUM-DEXTROSE 2-4 GM/100ML-% IV SOLN
2.0000 g | INTRAVENOUS | Status: AC
  Administered 2018-04-26: 2 g via INTRAVENOUS

## 2018-04-26 MED ORDER — FENTANYL CITRATE (PF) 100 MCG/2ML IJ SOLN
25.0000 ug | INTRAMUSCULAR | Status: DC | PRN
  Administered 2018-04-26 (×2): 50 ug via INTRAVENOUS

## 2018-04-26 MED ORDER — BUPIVACAINE HCL (PF) 0.25 % IJ SOLN
INTRAMUSCULAR | Status: DC | PRN
  Administered 2018-04-26: 30 mL

## 2018-04-26 MED ORDER — LACTATED RINGERS IR SOLN
Status: DC | PRN
  Administered 2018-04-26: 1000 mL

## 2018-04-26 MED ORDER — BUPIVACAINE HCL (PF) 0.25 % IJ SOLN
INTRAMUSCULAR | Status: AC
  Filled 2018-04-26: qty 30

## 2018-04-26 MED ORDER — EPHEDRINE 5 MG/ML INJ
INTRAVENOUS | Status: AC
  Filled 2018-04-26: qty 10

## 2018-04-26 MED ORDER — EPHEDRINE SULFATE-NACL 50-0.9 MG/10ML-% IV SOSY
PREFILLED_SYRINGE | INTRAVENOUS | Status: DC | PRN
  Administered 2018-04-26: 10 mg via INTRAVENOUS

## 2018-04-26 MED ORDER — SODIUM CHLORIDE 0.9% FLUSH: 3.0000 mL | INTRAVENOUS | Status: DC | PRN

## 2018-04-26 MED ORDER — ONDANSETRON HCL 4 MG/2ML IJ SOLN
INTRAMUSCULAR | Status: AC
  Filled 2018-04-26: qty 2

## 2018-04-26 MED ORDER — MIDAZOLAM HCL 2 MG/2ML IJ SOLN
INTRAMUSCULAR | Status: AC
  Filled 2018-04-26: qty 2

## 2018-04-26 MED ORDER — KETAMINE HCL 10 MG/ML IJ SOLN
INTRAMUSCULAR | Status: AC
  Filled 2018-04-26: qty 1

## 2018-04-26 MED ORDER — KETOROLAC TROMETHAMINE 15 MG/ML IJ SOLN
15.0000 mg | Freq: Four times a day (QID) | INTRAMUSCULAR | Status: DC
  Administered 2018-04-26: 15 mg via INTRAVENOUS

## 2018-04-26 MED ORDER — KETOROLAC TROMETHAMINE 30 MG/ML IJ SOLN
INTRAMUSCULAR | Status: AC
  Filled 2018-04-26: qty 1

## 2018-04-26 MED ORDER — CEFAZOLIN SODIUM-DEXTROSE 2-4 GM/100ML-% IV SOLN
INTRAVENOUS | Status: AC
  Filled 2018-04-26: qty 100

## 2018-04-26 SURGICAL SUPPLY — 49 items
APPLICATOR SURGIFLO ENDO (HEMOSTASIS) IMPLANT
BAG LAPAROSCOPIC 12 15 PORT 16 (BASKET) IMPLANT
BAG RETRIEVAL 12/15 (BASKET)
COVER BACK TABLE 60X90IN (DRAPES) ×2 IMPLANT
COVER TIP SHEARS 8 DVNC (MISCELLANEOUS) ×1 IMPLANT
COVER TIP SHEARS 8MM DA VINCI (MISCELLANEOUS) ×1
COVER WAND RF STERILE (DRAPES) IMPLANT
DECANTER SPIKE VIAL GLASS SM (MISCELLANEOUS) ×2 IMPLANT
DERMABOND ADVANCED (GAUZE/BANDAGES/DRESSINGS) ×1
DERMABOND ADVANCED .7 DNX12 (GAUZE/BANDAGES/DRESSINGS) ×1 IMPLANT
DRAPE ARM DVNC X/XI (DISPOSABLE) ×4 IMPLANT
DRAPE COLUMN DVNC XI (DISPOSABLE) ×1 IMPLANT
DRAPE DA VINCI XI ARM (DISPOSABLE) ×4
DRAPE DA VINCI XI COLUMN (DISPOSABLE) ×1
DRAPE SHEET LG 3/4 BI-LAMINATE (DRAPES) ×2 IMPLANT
DRAPE SURG IRRIG POUCH 19X23 (DRAPES) ×2 IMPLANT
ELECT REM PT RETURN 15FT ADLT (MISCELLANEOUS) ×2 IMPLANT
GLOVE BIO SURGEON STRL SZ 6 (GLOVE) ×8 IMPLANT
GLOVE BIO SURGEON STRL SZ 6.5 (GLOVE) ×4 IMPLANT
GOWN STRL REUS W/ TWL LRG LVL3 (GOWN DISPOSABLE) ×2 IMPLANT
GOWN STRL REUS W/TWL LRG LVL3 (GOWN DISPOSABLE) ×2
HOLDER FOLEY CATH W/STRAP (MISCELLANEOUS) ×2 IMPLANT
IRRIG SUCT STRYKERFLOW 2 WTIP (MISCELLANEOUS) ×2
IRRIGATION SUCT STRKRFLW 2 WTP (MISCELLANEOUS) ×1 IMPLANT
KIT PROCEDURE DA VINCI SI (MISCELLANEOUS)
KIT PROCEDURE DVNC SI (MISCELLANEOUS) IMPLANT
MANIPULATOR UTERINE 4.5 ZUMI (MISCELLANEOUS) ×2 IMPLANT
NEEDLE HYPO 22GX1.5 SAFETY (NEEDLE) IMPLANT
NEEDLE SPNL 18GX3.5 QUINCKE PK (NEEDLE) IMPLANT
OBTURATOR OPTICAL STANDARD 8MM (TROCAR) ×1
OBTURATOR OPTICAL STND 8 DVNC (TROCAR) ×1
OBTURATOR OPTICALSTD 8 DVNC (TROCAR) ×1 IMPLANT
PACK ROBOT GYN CUSTOM WL (TRAY / TRAY PROCEDURE) ×2 IMPLANT
PAD POSITIONING PINK XL (MISCELLANEOUS) ×2 IMPLANT
PORT ACCESS TROCAR AIRSEAL 12 (TROCAR) ×1 IMPLANT
PORT ACCESS TROCAR AIRSEAL 5M (TROCAR) ×1
POUCH SPECIMEN RETRIEVAL 10MM (ENDOMECHANICALS) IMPLANT
SEAL CANN UNIV 5-8 DVNC XI (MISCELLANEOUS) ×4 IMPLANT
SEAL XI 5MM-8MM UNIVERSAL (MISCELLANEOUS) ×4
SET TRI-LUMEN FLTR TB AIRSEAL (TUBING) ×2 IMPLANT
SURGIFLO W/THROMBIN 8M KIT (HEMOSTASIS) IMPLANT
SUT VIC AB 0 CT1 27 (SUTURE) ×1
SUT VIC AB 0 CT1 27XBRD ANTBC (SUTURE) ×1 IMPLANT
SYR 10ML LL (SYRINGE) IMPLANT
TOWEL OR NON WOVEN STRL DISP B (DISPOSABLE) ×2 IMPLANT
TRAP SPECIMEN MUCOUS 40CC (MISCELLANEOUS) IMPLANT
TRAY FOLEY MTR SLVR 16FR STAT (SET/KITS/TRAYS/PACK) ×2 IMPLANT
UNDERPAD 30X30 (UNDERPADS AND DIAPERS) ×2 IMPLANT
WATER STERILE IRR 1000ML POUR (IV SOLUTION) ×2 IMPLANT

## 2018-04-26 NOTE — Op Note (Signed)
OPERATIVE NOTE 04/26/18  Surgeon: Donaciano Eva   Assistants: Dr Lahoma Crocker (an MD assistant was necessary for tissue manipulation, management of robotic instrumentation, retraction and positioning due to the complexity of the case and hospital policies).   Anesthesia: General endotracheal anesthesia  ASA Class: 3   Pre-operative Diagnosis: abnormal postmenopausal bleeding, unable to sample endometrium due to cervical stenosis, cervical dysplasia  Post-operative Diagnosis: same and benign endometrium  Operation: Robotic-assisted laparoscopic total hysterectomy with bilateral salpingoophorectomy   Surgeon: Donaciano Eva  Assistant Surgeon: Lahoma Crocker MD  Anesthesia: GET  Urine Output: 150cc  Operative Findings:  : cervix flush with vagina, cervical os not visible, uterus grossly normal externally with approximately 8cm, grossly normal ovaries and tubes.   Estimated Blood Loss:  <<20cc      Total IV Fluids: 800 ml         Specimens: uterus with cervix and bilateral tubes         Complications:  None; patient tolerated the procedure well.         Disposition: PACU - hemodynamically stable.  Procedure Details  The patient was seen in the Holding Room. The risks, benefits, complications, treatment options, and expected outcomes were discussed with the patient.  The patient concurred with the proposed plan, giving informed consent.  The site of surgery properly noted/marked. The patient was identified as Theatre stage manager and the procedure verified as a Robotic-assisted hysterectomy with bilateral salpingectomy, possible staging. A Time Out was held and the above information confirmed.  After induction of anesthesia, the patient was draped and prepped in the usual sterile manner. Pt was placed in supine position after anesthesia and draped and prepped in the usual sterile manner. The abdominal drape was placed after the CholoraPrep had been allowed to  dry for 3 minutes.  Her arms were tucked to her side with all appropriate precautions.  The shoulders were stabilized with padded shoulder blocks applied to the acromium processes.  The patient was placed in the semi-lithotomy position in [place].  The perineum was prepped with Betadine. The patient was then prepped. Foley catheter was placed.  A sterile speculum was placed in the vagina.  The cervix was attempted to be grasped and dilated, however, due to it being flush with the vagina with no distinguishable os, it was not possible to place a uterine manipulator. An EEA sizer was placed in the vagina to define the vaginal fornices. OG tube placement was confirmed and to suction.   Next, a 5 mm skin incision was made 1 cm below the subcostal margin in the midclavicular line.  The 5 mm Optiview port and scope was used for direct entry.  Opening pressure was under 10 mm CO2.  The abdomen was insufflated and the findings were noted as above.   At this point and all points during the procedure, the patient's intra-abdominal pressure did not exceed 15 mmHg. Next, a 10 mm skin incision was made in the umbilicus and a right and left port was placed about 10 cm lateral to the robot port on the right and left side.  A fourth arm was placed in the left lower quadrant 2 cm above and superior and medial to the anterior superior iliac spine.  All ports were placed under direct visualization.  The patient was placed in steep Trendelenburg.  Bowel was folded away into the upper abdomen.  The robot was docked in the normal manner.  The hysterectomy was started after the round  ligament on the right side was incised and the retroperitoneum was entered and the pararectal space was developed.  The ureter was noted to be on the medial leaf of the broad ligament.  The fallopian tube was dissected from the ovary. The peritoneum above the ureter was incised and stretched and the utero-ovarian ligament was skeletonized,  cauterized and cut.  There was meticulous dissection of the ureter to mobilize it medially off of the uretine artery at its origin. The posterior peritoneum was taken down and transversely incised behind the cervix. The rectovaginal septum was developed with sharp dissection to drop the rectum from the upper vagina.  The anterior peritoneum was also taken down.  The bladder flap was created to the level of the inferior aspect of the EEA sizer.  The uterine artery on the right side was skeletonized, cauterized and cut in the normal manner at the uterine isthmus. It was skeletonized off of the lateral cervix and the lateral upper vagina was tubularized to drop the bladder and ureter laterally.  A similar procedure was performed on the left.  The colpotomy was made and the uterus, cervix, bilateral tubes were amputated and delivered through the vagina.  Pedicles were inspected and excellent hemostasis was achieved.    The colpotomy at the vaginal cuff was closed with Vicryl on a CT1 needle in a running manner incorporating the posterior peritoneum with the incision.  Irrigation was used and excellent hemostasis was achieved. Frozen section of the endometrium was benign. At this point in the procedure was completed.  Robotic instruments were removed under direct visulaization.  The robot was undocked. The 10 mm ports were closed with Vicryl on a UR-5 needle and the fascia was closed with 0 Vicryl on a UR-5 needle.  The skin was closed with 4-0 Vicryl in a subcuticular manner.  Dermabond was applied.  Sponge, lap and needle counts correct x 2.  The patient was taken to the recovery room in stable condition.  The vagina was swabbed with  minimal bleeding noted.   All instrument and needle counts were correct x  3.   The patient was transferred to the recovery room in a running condition.  Donaciano Eva, MD

## 2018-04-26 NOTE — Transfer of Care (Signed)
Immediate Anesthesia Transfer of Care Note  Patient: Teresa Kaiser  Procedure(s) Performed: XI ROBOTIC ASSISTED TOTAL HYSTERECTOMY WITH BILATERAL SALPINGECTOMY (Bilateral Abdomen)  Patient Location: PACU  Anesthesia Type:general  Level of Consciousness: awake, alert  and oriented  Airway & Oxygen Therapy: Patient Spontanous Breathing and Patient connected to face mask oxygen  Post-op Assessment: Report given to RN and Post -op Vital signs reviewed and stable  Post vital signs: Reviewed and stable  Last Vitals:  Vitals Value Taken Time  BP 140/63 04/26/2018  9:40 AM  Temp    Pulse 76 04/26/2018  9:40 AM  Resp 18 04/26/2018  9:41 AM  SpO2 100 % 04/26/2018  9:40 AM  Vitals shown include unvalidated device data.  Last Pain:  Vitals:   04/26/18 0556  TempSrc:   PainSc: 0-No pain         Complications: No apparent anesthesia complications

## 2018-04-26 NOTE — Anesthesia Preprocedure Evaluation (Signed)
Anesthesia Evaluation  Patient identified by MRN, date of birth, ID band Patient awake    Reviewed: Allergy & Precautions, H&P , NPO status , Patient's Chart, lab work & pertinent test results  Airway Mallampati: II   Neck ROM: full    Dental   Pulmonary neg pulmonary ROS,    breath sounds clear to auscultation       Cardiovascular negative cardio ROS   Rhythm:regular Rate:Normal     Neuro/Psych    GI/Hepatic   Endo/Other    Renal/GU      Musculoskeletal   Abdominal   Peds  Hematology   Anesthesia Other Findings   Reproductive/Obstetrics Abnormal uterine bleeding                             Anesthesia Physical Anesthesia Plan  ASA: I  Anesthesia Plan: General   Post-op Pain Management:    Induction: Intravenous  PONV Risk Score and Plan: 3 and Ondansetron, Dexamethasone, Midazolam and Treatment may vary due to age or medical condition  Airway Management Planned: Oral ETT  Additional Equipment:   Intra-op Plan:   Post-operative Plan: Extubation in OR  Informed Consent: I have reviewed the patients History and Physical, chart, labs and discussed the procedure including the risks, benefits and alternatives for the proposed anesthesia with the patient or authorized representative who has indicated his/her understanding and acceptance.       Plan Discussed with: CRNA, Anesthesiologist and Surgeon  Anesthesia Plan Comments:         Anesthesia Quick Evaluation

## 2018-04-26 NOTE — Interval H&P Note (Signed)
History and Physical Interval Note:  04/26/2018 7:19 AM  Teresa Kaiser  has presented today for surgery, with the diagnosis of abnormal uterine bleeding  The various methods of treatment have been discussed with the patient and family. After consideration of risks, benefits and other options for treatment, the patient has consented to  Procedure(s): XI ROBOTIC ASSISTED TOTAL HYSTERECTOMY WITH BILATERAL SALPINGECTOMY, POSSIBLE BILATERAL OOPHORECTOMY, POSSIBLE STAGING (Bilateral) as a surgical intervention .  The patient's history has been reviewed, patient examined, no change in status, stable for surgery.  I have reviewed the patient's chart and labs.  The preoperative biopsy was non-diagnostic and therefore we will perform frozen section intraoperatively to determine the need for staging. 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 SHe desires ovarian preservation if there are normal appearing ovaries and no malignancy in the uterus. I think that this is reasonable given that her family history is not overly concerning for Lynch syndrome and therefore I believe that she is of average risk for ovarian cancer in the future (quoted the patient 1 in 56). Questions were answered to the patient's satisfaction.     Teresa Kaiser

## 2018-04-26 NOTE — Discharge Instructions (Signed)
04/26/2018  Return to work: 4 weeks  Activity: 1. Be up and out of the bed during the day.  Take a nap if needed.  You may walk up steps but be careful and use the hand rail.  Stair climbing will tire you more than you think, you may need to stop part way and rest.   2. No lifting or straining for 4 weeks.  3. No driving for 1 week.  Do Not drive if you are taking narcotic pain medicine.  4. Shower daily.  Use soap and water on your incision and pat dry; don't rub.   5. No sexual activity and nothing in the vagina for 8 weeks.  Medications:  - Take ibuprofen and tylenol first line for pain control. Take these regularly (every 6 hours) to decrease the build up of pain.  - If necessary, for severe pain not relieved by ibuprofen, take percocet.  - While taking percocet you should take sennakot every night to reduce the likelihood of constipation. If this causes diarrhea, stop its use.  Diet: 1. Low sodium Heart Healthy Diet is recommended.  2. It is safe to use a laxative if you have difficulty moving your bowels.   Wound Care: 1. Keep clean and dry.  Shower daily.  Reasons to call the Doctor:   Fever - Oral temperature greater than 100.4 degrees Fahrenheit  Foul-smelling vaginal discharge  Difficulty urinating  Nausea and vomiting  Increased pain at the site of the incision that is unrelieved with pain medicine.  Difficulty breathing with or without chest pain  New calf pain especially if only on one side  Sudden, continuing increased vaginal bleeding with or without clots.   Follow-up: 1. See Teresa Kaiser in 3-4 weeks.  Contacts: For questions or concerns you should contact:  Dr. Everitt Kaiser at [phone] After hours and on week-ends call [phone] and ask to speak to the physician on call for Gynecologic Oncology    Total Laparoscopic Hysterectomy, Care After This sheet gives you information about how to care for yourself after your procedure. Your health  care provider may also give you more specific instructions. If you have problems or questions, contact your health care provider. What can I expect after the procedure? After the procedure, it is common to have:  Pain and bruising around your incisions.  A sore throat, if a breathing tube was used during surgery.  Fatigue.  Poor appetite.  Less interest in sex. If your ovaries were also removed, it is also common to have symptoms of menopause such as hot flashes, night sweats, and lack of sleep (insomnia). Follow these instructions at home: Bathing  Do not take baths, swim, or use a hot tub until your health care provider approves. You may need to only take showers for 2-3 weeks.  Keep your bandage (dressing) dry until your health care provider says it can be removed. Incision care   Follow instructions from your health care provider about how to take care of your incisions. Make sure you: ? Wash your hands with soap and water before you change your dressing. If soap and water are not available, use hand sanitizer. ? Change your dressing as told by your health care provider. ? Leave stitches (sutures), skin glue, or adhesive strips in place. These skin closures may need to stay in place for 2 weeks or longer. If adhesive strip edges start to loosen and curl up, you may trim the loose edges. Do not remove  adhesive strips completely unless your health care provider tells you to do that.  Check your incision area every day for signs of infection. Check for: ? Redness, swelling, or pain. ? Fluid or blood. ? Warmth. ? Pus or a bad smell. Activity  Get plenty of rest and sleep.  Do not lift anything that is heavier than 10 lbs (4.5 kg) for one month after surgery, or as long as told by your health care provider.  Do not drive or use heavy machinery while taking prescription pain medicine.  Do not drive for 24 hours if you were given a medicine to help you relax  (sedative).  Return to your normal activities as told by your health care provider. Ask your health care provider what activities are safe for you. Lifestyle   Do not use any products that contain nicotine or tobacco, such as cigarettes and e-cigarettes. These can delay healing. If you need help quitting, ask your health care provider.  Do not drink alcohol until your health care provider approves. General instructions  Do not douche, use tampons, or have sex for at least 6 weeks, or as told by your health care provider.  Take over-the-counter and prescription medicines only as told by your health care provider.  To monitor yourself for a fever, take your temperature at least once a day during recovery.  If you struggle with physical or emotional changes after your procedure, speak with your health care provider or a therapist.  To prevent or treat constipation while you are taking prescription pain medicine, your health care provider may recommend that you: ? Drink enough fluid to keep your urine clear or pale yellow. ? Take over-the-counter or prescription medicines. ? Eat foods that are high in fiber, such as fresh fruits and vegetables, whole grains, and beans. ? Limit foods that are high in fat and processed sugars, such as fried and sweet foods.  Keep all follow-up visits as told by your health care provider. This is important. Contact a health care provider if:  You have chills or a fever.  You have redness, swelling, or pain around an incision.  You have fluid or blood coming from an incision.  Your incision feels warm to the touch.  You have pus or a bad smell coming from an incision.  An incision breaks open.  You feel dizzy or light-headed.  You have pain or bleeding when you urinate.  You have diarrhea, nausea, or vomiting that does not go away.  You have abnormal vaginal discharge.  You have a rash.  You have pain that does not get better with  medicine. Get help right away if:  You have a fever and your symptoms suddenly get worse.  You have severe abdominal pain.  You have chest pain.  You have shortness of breath.  You faint.  You have pain, swelling, or redness on your leg.  You have heavy vaginal bleeding with blood clots. Summary  After the procedure it is common to have abdominal pain. Your provider will give you medication for this.  Do not take baths, swim, or use a hot tub until your health care provider approves.  Do not lift anything that is heavier than 10 lbs (4.5 kg) for one month after surgery, or as long as told by your health care provider.  Notify your provider if you have any signs or symptoms of infection after the procedure. This information is not intended to replace advice given to you by your  health care provider. Make sure you discuss any questions you have with your health care provider. Document Released: 12/21/2012 Document Revised: 05/13/2016 Document Reviewed: 05/13/2016 Elsevier Interactive Patient Education  2019 [employer].

## 2018-04-26 NOTE — Anesthesia Procedure Notes (Signed)
Procedure Name: Intubation Date/Time: 04/26/2018 7:39 AM Performed by: [place] Torie Towle C, CRNA Pre-anesthesia Checklist: Patient identified, Emergency Drugs available, Suction available and Patient being monitored Patient Re-evaluated:Patient Re-evaluated prior to induction Oxygen Delivery Method: Circle system utilized Preoxygenation: Pre-oxygenation with 100% oxygen Induction Type: IV induction Ventilation: Mask ventilation without difficulty Laryngoscope Size: Glidescope, Mac and 3 Tube type: Oral Tube size: 7.0 mm Number of attempts: 2 Airway Equipment and Method: Stylet and Video-laryngoscopy Placement Confirmation: ETT inserted through vocal cords under direct vision,  positive ETCO2 and breath sounds checked- equal and bilateral Secured at: 22 cm Tube secured with: Tape Dental Injury: Teeth and Oropharynx as per pre-operative assessment  Difficulty Due To: Difficulty was unanticipated and Difficult Airway- due to anterior larynx Future Recommendations: Recommend- induction with short-acting agent, and alternative techniques readily available Comments: DL x 1 by CRNA with Mac 3, top of epiglottis only seen, DL x 1 with Mac 3 by MDA with same view, changed to DL with Glidescope Mac 3, grade 2 view, 7.0 ETT placed, VSS throughout

## 2018-04-26 NOTE — Anesthesia Postprocedure Evaluation (Signed)
Anesthesia Post Note  Patient: Teresa Kaiser  Procedure(s) Performed: XI ROBOTIC ASSISTED TOTAL HYSTERECTOMY WITH BILATERAL SALPINGECTOMY (Bilateral Abdomen)     Patient location during evaluation: PACU Anesthesia Type: General Level of consciousness: awake and alert Pain management: pain level controlled Vital Signs Assessment: post-procedure vital signs reviewed and stable Respiratory status: spontaneous breathing, nonlabored ventilation, respiratory function stable and patient connected to nasal cannula oxygen Cardiovascular status: blood pressure returned to baseline and stable Postop Assessment: no apparent nausea or vomiting Anesthetic complications: no    Last Vitals:  Vitals:   04/26/18 1029 04/26/18 1215  BP: (!) 146/84 140/80  Pulse: 88 85  Resp: 15 15  Temp: 36.6 C 36.6 C  SpO2: 100% 100%    Last Pain:  Vitals:   04/26/18 1215  TempSrc:   PainSc: 2                  Arbor Cohen S

## 2018-04-27 ENCOUNTER — Telehealth: Payer: Self-pay

## 2018-04-27 ENCOUNTER — Encounter (HOSPITAL_COMMUNITY): Payer: Self-pay | Admitting: Gynecologic Oncology

## 2018-04-27 NOTE — Telephone Encounter (Signed)
Told Ms Penner that the final pathology showed no cancer per Joylene John, NP. Pt states that she is doing well after her surgery . Eating well Urinating. Bowels have not moved yet. Taking stool softener.  Encouraged her to use a capful of miralax tomorrow evening in addition to colace if no BM. Pt knows to call the office if she has any issues or concerns prior to her post op appointment on 05-20-18. Ms Ringold will call the office the week of 05-09-18 if she feels she is ready to return to work with activity restrictions.

## 2018-05-05 ENCOUNTER — Telehealth: Payer: Self-pay

## 2018-05-05 NOTE — Telephone Encounter (Signed)
Incoming call from pt- see previous note- she wants to make appt with Dr Denman George because she verbalized that she may be ready to return to work and needs her post-op f/u.  Scheduled for next Wednesday at 1:30 pm No other needs per pt at this time.

## 2018-05-06 ENCOUNTER — Telehealth: Payer: Self-pay | Admitting: *Deleted

## 2018-05-06 NOTE — Telephone Encounter (Signed)
Patient called in to see if our office has faxed over her FMLA and Short-term disability paperwork.  I told the patient that our office faxed over the paperwork to her company representative Augustine Radar on 04/18/2018 and that we had a fax confirmation.  Patient was very Patent attorney.  I informed the patient to call our office back if for any reason the paperwork was not received and our office would be happy to fax it over again.  Patient was very appreciative and verbalized understanding.

## 2018-05-10 ENCOUNTER — Telehealth: Payer: Self-pay | Admitting: *Deleted

## 2018-05-10 NOTE — Telephone Encounter (Signed)
Returned call to [facility] to confirm that the patient had surgery on 04/26/2018

## 2018-05-11 ENCOUNTER — Encounter: Payer: Self-pay | Admitting: Gynecologic Oncology

## 2018-05-11 ENCOUNTER — Inpatient Hospital Stay: Payer: BLUE CROSS/BLUE SHIELD | Attending: Gynecologic Oncology | Admitting: Gynecologic Oncology

## 2018-05-11 VITALS — BP 130/88 | HR 100 | Temp 97.5°F | Resp 20 | Ht 64.0 in | Wt 167.0 lb

## 2018-05-11 DIAGNOSIS — N950 Postmenopausal bleeding: Secondary | ICD-10-CM | POA: Insufficient documentation

## 2018-05-11 DIAGNOSIS — Z90722 Acquired absence of ovaries, bilateral: Secondary | ICD-10-CM | POA: Insufficient documentation

## 2018-05-11 DIAGNOSIS — Z90710 Acquired absence of both cervix and uterus: Secondary | ICD-10-CM | POA: Diagnosis not present

## 2018-05-11 MED ORDER — OXYCODONE HCL 5 MG PO TABS: 5.0000 mg | ORAL_TABLET | ORAL | 0 refills | Status: DC | PRN

## 2018-05-11 NOTE — Progress Notes (Signed)
Follow-up Note: Gyn-Onc  Consult was initially requested by Dr. Ronita Kaiser for the evaluation of Teresa Kaiser 58 y.o. female  CC:  Chief Complaint  Patient presents with  . PMB (postmenopausal bleeding)    Assessment/Plan:  [name]  is a 58 y.o.  year old s/p a hysterectomy, bilateral salpingectomy for postmenopausal bleeding and inability to sample endometrium due to a stenotic cervix.  Recommend annual pap cytology due to history of high grade cervical dysplasia in 2016.  HPI: [name] is a 58 year old P1 who is seen in consultation at the request of Dr Teresa Kaiser for abnormal uterine bleeding, and postmenopausal bleeding.  She reports a single episode of postmenopausal bleeding in December 2019.  She was seen by Dr. Rutherford Kaiser who performed a transvaginal ultrasound on March 14, 2018 which revealed a uterus measuring 7.2 x 5 x 4.6 cm with an endometrial thickness of 13 mm with blood flow noted within.  There is possibly one submucosal fibroid measuring 3.4 cm.  The ovaries bilaterally were normal with no abnormalities.  A Pap smear was taken earlier that year in September 2019 which was cytologically normal and negative for high-risk HPV testing.  The patient expressed concern regarding the possibility of endometrial cancer and a biopsy was attempted.  Due to the patient's stenotic cervix this was unsuccessful even with ultrasound guidance and with use of Cytotec vaginally.  One episode of sampling retrieved only scant benign epithelium with no endometrial cells seen.  In 2016 she had abnormal Pap smear which resulted in colposcopy and then subsequent cold knife conization.  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  She has a remote history of a tubal ligation and a D&C.  She has a family history significant for mother with a history of endometrial cancer.  She denies a family  history of colon cancer.  She has had one prior vaginal delivery in the past.  She is otherwise healthy with a BMI of 29 kg/m.  Interval Hx:   On April 26, 2018 she underwent a robotic assisted total hysterectomy with bilateral salpingectomy intraoperative findings were significant for cervix flush with the vagina, cervical awes not visible, uterus grossly normal externally with normal ovaries and tubes.  Surgery was uncomplicated.  Final pathology revealed an inflamed atrophic cervix, and a cystic atrophic endometrium but no malignancy.  Current Meds:  Outpatient Encounter Medications as of 05/11/2018  Medication Sig  . oxyCODONE-acetaminophen (PERCOCET/ROXICET) 5-325 MG tablet Take 1-2 tablets by mouth every 4 (four) hours as needed for severe pain.  Marland Kitchen oxyCODONE (OXY IR/ROXICODONE) 5 MG immediate release tablet Take 1 tablet (5 mg total) by mouth every 4 (four) hours as needed for severe pain.   No facility-administered encounter medications on file as of 05/11/2018.     Allergy:  Allergies  Allergen Reactions  . Clindamycin/Lincomycin Rash    May not be a true allergy, has tolerated penicillin in the past     Social Hx:   Social History   Socioeconomic History  . Marital status: Married    Spouse name: Not on file  . Number of children: Not on file  . Years of education: Not on file  . Highest education level: Not on file  Occupational History  . Not on file  Social Needs  . Financial resource strain: Not on file  . Food insecurity:    Worry: Not on file  Inability: Not on file  . Transportation needs:    Medical: Not on file    Non-medical: Not on file  Tobacco Use  . Smoking status: Never Smoker  . Smokeless tobacco: Never Used  Substance and Sexual Activity  . Alcohol use: No    Alcohol/week: 0.0 standard drinks  . Drug use: No  . Sexual activity: Yes    Partners: Male    Birth control/protection: Post-menopausal  Lifestyle  . Physical activity:     Days per week: Not on file    Minutes per session: Not on file  . Stress: Not on file  Relationships  . Social connections:    Talks on phone: Not on file    Gets together: Not on file    Attends religious service: Not on file    Active member of club or organization: Not on file    Attends meetings of clubs or organizations: Not on file    Relationship status: Not on file  . Intimate partner violence:    Fear of current or ex partner: Not on file    Emotionally abused: Not on file    Physically abused: Not on file    Forced sexual activity: Not on file  Other Topics Concern  . Not on file  Social History Narrative  . Not on file    Past Surgical Hx:  Past Surgical History:  Procedure Laterality Date  . CERVICAL CONIZATION W/BX N/A 08/21/2014   Procedure: CONIZATION CERVIX WITH BIOPSY;  Margin positive with atypia - Surgeon: Teresa Cobbs, MD;  Location: [facility];  Service: Gynecology;  Laterality: N/A;  request 1.25 hours  . DILATION AND CURETTAGE OF UTERUS  03/2005   DUB--neg. for hyperplasia or dysplasia/Dr. Sena Kaiser  . DILATION AND CURETTAGE OF UTERUS N/A 08/21/2014   Procedure: DILATATION AND CURETTAGE fractional ;  Surgeon: Teresa Cobbs, MD;  Location: [facility];  Service: Gynecology;  Laterality: N/A;  . fatty tumor removal  07/2005   --benign--under left breast-was actually on ribcage--done in [facility]  . ROBOTIC ASSISTED TOTAL HYSTERECTOMY WITH BILATERAL SALPINGO OOPHERECTOMY Bilateral 04/26/2018   Procedure: XI ROBOTIC ASSISTED TOTAL HYSTERECTOMY WITH BILATERAL SALPINGECTOMY;  Surgeon: Teresa Amber, MD;  Location: [facility];  Service: Gynecology;  Laterality: Bilateral;    Past Medical Hx:  Past Medical History:  Diagnosis Date  . Abnormal uterine bleeding (AUB)   . Cancer (HCC)    pre-cancerous HPV   . 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  Family Hx:  Family History  Problem Relation Age of Onset  . Cancer Mother 66       dec--?endometrial ca  . Heart attack Father 8       dec    Review of Systems:  Constitutional  Feels well,    ENT Normal appearing ears and nares bilaterally Skin/Breast  No rash, sores, jaundice, itching, dryness Cardiovascular  No chest pain, shortness of breath, or edema  Pulmonary  No cough or wheeze.  Gastro Intestinal  No nausea, vomitting, or diarrhoea. No bright red blood per rectum, no abdominal pain, change in bowel movement, or constipation.  Genito Urinary  No frequency, urgency, dysuria, no bleeding Musculo Skeletal  No myalgia, arthralgia, joint swelling or pain  Neurologic  No weakness, numbness, change in gait,  Psychology  No depression, anxiety, insomnia.   Vitals:  Blood pressure 130/88, pulse 100, temperature (!) 97.5 F (36.4 C), temperature source Oral, resp. rate 20, height 5\' 4"  (1.626 m), weight 167 lb (75.8 kg), last menstrual period 10/14/2012, SpO2 99 %.  Physical Exam: WD in NAD Neck  Supple NROM, without any enlargements.  Lymph Node Survey No cervical supraclavicular or inguinal adenopathy Cardiovascular  Pulse normal rate, regularity and rhythm. S1 and S2 normal.  Lungs  Clear to auscultation bilateraly, without wheezes/crackles/rhonchi. Good air movement.  Skin  No rash/lesions/breakdown  Psychiatry  Alert and oriented to person, place, and time  Abdomen  Normoactive bowel sounds, abdomen soft, non-tender and slightly overweight without evidence of hernia. Well healed incisions.  Back No CVA tenderness Genito Urinary  Surgically absent cervix. Vaginal cuff healing normally. Rectal  deferred Extremities  No bilateral cyanosis, clubbing or edema.   [name], MD  05/11/2018, 4:29 PM

## 2018-05-11 NOTE — Patient Instructions (Signed)
Dr Denman George is clearing you to return to work on 05/17/18.  She will phone in a prescription of oxycodone for pain medication.   Please contact Dr Serita Grit office (at [phone]) in October, 2020 to request an appointment with her for February, 2021.

## 2018-05-20 ENCOUNTER — Ambulatory Visit: Payer: BLUE CROSS/BLUE SHIELD | Admitting: Gynecologic Oncology

## 2018-08-23 DIAGNOSIS — R635 Abnormal weight gain: Secondary | ICD-10-CM | POA: Diagnosis not present

## 2018-08-23 DIAGNOSIS — R7309 Other abnormal glucose: Secondary | ICD-10-CM | POA: Diagnosis not present

## 2018-08-23 DIAGNOSIS — E782 Mixed hyperlipidemia: Secondary | ICD-10-CM | POA: Diagnosis not present

## 2018-08-23 DIAGNOSIS — Z72820 Sleep deprivation: Secondary | ICD-10-CM | POA: Diagnosis not present

## 2018-08-23 DIAGNOSIS — N951 Menopausal and female climacteric states: Secondary | ICD-10-CM | POA: Diagnosis not present

## 2018-08-29 DIAGNOSIS — Z1331 Encounter for screening for depression: Secondary | ICD-10-CM | POA: Diagnosis not present

## 2018-08-29 DIAGNOSIS — Z1339 Encounter for screening examination for other mental health and behavioral disorders: Secondary | ICD-10-CM | POA: Diagnosis not present

## 2018-08-29 DIAGNOSIS — Z6830 Body mass index (BMI) 30.0-30.9, adult: Secondary | ICD-10-CM | POA: Diagnosis not present

## 2018-08-29 DIAGNOSIS — E7800 Pure hypercholesterolemia, unspecified: Secondary | ICD-10-CM | POA: Diagnosis not present

## 2018-08-29 DIAGNOSIS — R7303 Prediabetes: Secondary | ICD-10-CM | POA: Diagnosis not present

## 2018-08-29 DIAGNOSIS — E782 Mixed hyperlipidemia: Secondary | ICD-10-CM | POA: Diagnosis not present

## 2018-09-05 DIAGNOSIS — Z6830 Body mass index (BMI) 30.0-30.9, adult: Secondary | ICD-10-CM | POA: Diagnosis not present

## 2018-09-05 DIAGNOSIS — R7303 Prediabetes: Secondary | ICD-10-CM | POA: Diagnosis not present

## 2018-09-07 DIAGNOSIS — L821 Other seborrheic keratosis: Secondary | ICD-10-CM | POA: Diagnosis not present

## 2018-09-07 DIAGNOSIS — L719 Rosacea, unspecified: Secondary | ICD-10-CM | POA: Diagnosis not present

## 2018-09-07 DIAGNOSIS — L738 Other specified follicular disorders: Secondary | ICD-10-CM | POA: Diagnosis not present

## 2018-09-13 DIAGNOSIS — R7303 Prediabetes: Secondary | ICD-10-CM | POA: Diagnosis not present

## 2018-09-13 DIAGNOSIS — Z6829 Body mass index (BMI) 29.0-29.9, adult: Secondary | ICD-10-CM | POA: Diagnosis not present

## 2018-09-15 DIAGNOSIS — L718 Other rosacea: Secondary | ICD-10-CM | POA: Diagnosis not present

## 2018-11-01 DIAGNOSIS — L718 Other rosacea: Secondary | ICD-10-CM | POA: Diagnosis not present

## 2019-02-22 ENCOUNTER — Telehealth: Payer: Self-pay | Admitting: *Deleted

## 2019-02-22 NOTE — Telephone Encounter (Signed)
Patient called and scheduled a follow up appt in Feb

## 2019-04-26 ENCOUNTER — Inpatient Hospital Stay: Payer: BC Managed Care – PPO | Attending: Gynecologic Oncology | Admitting: Gynecologic Oncology

## 2019-04-26 ENCOUNTER — Other Ambulatory Visit: Payer: Self-pay

## 2019-04-26 ENCOUNTER — Encounter: Payer: Self-pay | Admitting: Gynecologic Oncology

## 2019-04-26 ENCOUNTER — Other Ambulatory Visit (HOSPITAL_COMMUNITY)
Admission: RE | Admit: 2019-04-26 | Discharge: 2019-04-26 | Disposition: A | Discharge status: Home / Self Care | Payer: BC Managed Care – PPO | Source: Ambulatory Visit | Attending: Gynecologic Oncology | Admitting: Gynecologic Oncology

## 2019-04-26 VITALS — BP 130/70 | HR 75 | Temp 97.8°F | Resp 18 | Ht 64.0 in | Wt 177.0 lb

## 2019-04-26 DIAGNOSIS — Z1151 Encounter for screening for human papillomavirus (HPV): Secondary | ICD-10-CM | POA: Diagnosis present

## 2019-04-26 DIAGNOSIS — D069 Carcinoma in situ of cervix, unspecified: Secondary | ICD-10-CM | POA: Diagnosis present

## 2019-04-26 DIAGNOSIS — Z90722 Acquired absence of ovaries, bilateral: Secondary | ICD-10-CM | POA: Diagnosis not present

## 2019-04-26 DIAGNOSIS — Z90711 Acquired absence of uterus with remaining cervical stump: Secondary | ICD-10-CM | POA: Diagnosis not present

## 2019-04-26 DIAGNOSIS — Z90710 Acquired absence of both cervix and uterus: Secondary | ICD-10-CM | POA: Insufficient documentation

## 2019-04-26 NOTE — Progress Notes (Signed)
Follow-up Note: Gyn-Onc  Consult was initially requested by Dr. Ronita Hipps for the evaluation of Teresa Kaiser 59 y.o. female  CC:  Chief Complaint  Patient presents with  . Carcinoma in situ of cervix, unspecified location    Assessment/Plan:  Teresa Kaiser  is a 59 y.o.  year old s/p a hysterectomy, bilateral salpingectomy for postmenopausal bleeding and inability to sample endometrium due to a stenotic cervix. Hx of CIN 3 in 2016.  Recommend return for annual pap cytology due to history of high grade cervical dysplasia in 2016.  HPI: Teresa Kaiser is a 59 year old P1 who is seen in consultation at the request of Dr Ronita Hipps for abnormal uterine bleeding, and postmenopausal bleeding.  She reports a single episode of postmenopausal bleeding in December 2019.  She was seen by Dr. Rutherford Limerick who performed a transvaginal ultrasound on March 14, 2018 which revealed a uterus measuring 7.2 x 5 x 4.6 cm with an endometrial thickness of 13 mm with blood flow noted within.  There is possibly one submucosal fibroid measuring 3.4 cm.  The ovaries bilaterally were normal with no abnormalities.  A Pap smear was taken earlier that year in September 2019 which was cytologically normal and negative for high-risk HPV testing.  The patient expressed concern regarding the possibility of endometrial cancer and a biopsy was attempted.  Due to the patient's stenotic cervix this was unsuccessful even with ultrasound guidance and with use of Cytotec vaginally.  One episode of sampling retrieved only scant benign epithelium with no endometrial cells seen.  In 2016 she had abnormal Pap smear which resulted in colposcopy and then subsequent cold knife conization.  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  She has a remote history of a tubal ligation and a D&C.  She has a family history significant for mother with a  history of endometrial cancer.  She denies a family history of colon cancer.  She has had one prior vaginal delivery in the past.  She is otherwise healthy with a BMI of 29 kg/m.  Interval Hx:   On April 26, 2018 she underwent a robotic assisted total hysterectomy with bilateral salpingectomy intraoperative findings were significant for cervix flush with the vagina, cervical awes not visible, uterus grossly normal externally with normal ovaries and tubes.  Surgery was uncomplicated.  Final pathology revealed an inflamed atrophic cervix, and a cystic atrophic endometrium but no malignancy.  She returned today for routine vaginal cytology and HPV screening surveillance due to her history of CIN 3. She mentioned that she is due for a mammogram but [facility] there.   Current Meds:  Outpatient Encounter Medications as of 04/26/2019  Medication Sig  . [DISCONTINUED] oxyCODONE (OXY IR/ROXICODONE) 5 MG immediate release tablet Take 1 tablet (5 mg total) by mouth every 4 (four) hours as needed for severe pain.  . [DISCONTINUED] oxyCODONE-acetaminophen (PERCOCET/ROXICET) 5-325 MG tablet Take 1-2 tablets by mouth every 4 (four) hours as needed for severe pain.   No facility-administered encounter medications on file as of 04/26/2019.    Allergy:  Allergies  Allergen Reactions  . Clindamycin/Lincomycin Rash    May not be a true allergy, has tolerated penicillin in the past     Social Hx:   Social History   Socioeconomic History  . Marital status: Married    Spouse name: Not  on file  . Number of children: Not on file  . Years of education: Not on file  . Highest education level: Not on file  Occupational History  . Not on file  Tobacco Use  . Smoking status: Never Smoker  . Smokeless tobacco: Never Used  Substance and Sexual Activity  . Alcohol use: No    Alcohol/week: 0.0 standard drinks  . Drug use: No  . Sexual  activity: Yes    Partners: Male    Birth control/protection: Post-menopausal  Other Topics Concern  . Not on file  Social History Narrative  . Not on file   Social Determinants of Health   Financial Resource Strain:   . Difficulty of Paying Living Expenses: Not on file  Food Insecurity:   . Worried About Charity fundraiser in the Last Year: Not on file  . Ran Out of Food in the Last Year: Not on file  Transportation Needs:   . Lack of Transportation (Medical): Not on file  . Lack of Transportation (Non-Medical): Not on file  Physical Activity:   . Days of Exercise per Week: Not on file  . Minutes of Exercise per Session: Not on file  Stress:   . Feeling of Stress : Not on file  Social Connections:   . Frequency of Communication with Friends and Family: Not on file  . Frequency of Social Gatherings with Friends and Family: Not on file  . Attends Religious Services: Not on file  . Active Member of Clubs or Organizations: Not on file  . Attends Archivist Meetings: Not on file  . Marital Status: Not on file  Intimate Partner Violence:   . Fear of Current or Ex-Partner: Not on file  . Emotionally Abused: Not on file  . Physically Abused: Not on file  . Sexually Abused: Not on file    Past Surgical Hx:  Past Surgical History:  Procedure Laterality Date  . CERVICAL CONIZATION W/BX N/A 08/21/2014   Procedure: CONIZATION CERVIX WITH BIOPSY;  Margin positive with atypia - Surgeon: Nunzio Cobbs, MD;  Location: [facility];  Service: Gynecology;  Laterality: N/A;  request 1.25 hours  . DILATION AND CURETTAGE OF UTERUS  03/2005   DUB--neg. for hyperplasia or dysplasia/Dr. Sena Slate  . DILATION AND CURETTAGE OF UTERUS N/A 08/21/2014   Procedure: DILATATION AND CURETTAGE fractional ;  Surgeon: Nunzio Cobbs, MD;  Location: [facility];  Service: Gynecology;  Laterality: N/A;  . fatty tumor removal  07/2005   --benign--under left breast-was actually  on ribcage--done in [facility]  . ROBOTIC ASSISTED TOTAL HYSTERECTOMY WITH BILATERAL SALPINGO OOPHERECTOMY Bilateral 04/26/2018   Procedure: XI ROBOTIC ASSISTED TOTAL HYSTERECTOMY WITH BILATERAL SALPINGECTOMY;  Surgeon: Everitt Amber, MD;  Location: [facility];  Service: Gynecology;  Laterality: Bilateral;    Past Medical Hx:  Past Medical History:  Diagnosis Date  . Abnormal uterine bleeding (AUB)   . Cancer (HCC)    pre-cancerous HPV   . Pap smear abnormality of cervix with HGSIL 06-21-14   :Pos HR HPV    Past Gynecological History:  SVD x 1, hx of CIN 3 Patient's last menstrual period was 10/14/2012 (approximate).  Family Hx:  Family History  Problem Relation Age of Onset  . Cancer Mother 46       dec--?endometrial ca  . Heart attack Father 80       dec    Review of Systems:  Constitutional  Feels well,  ENT Normal appearing ears and nares bilaterally Skin/Breast  No rash, sores, jaundice, itching, dryness Cardiovascular  No chest pain, shortness of breath, or edema  Pulmonary  No cough or wheeze.  Gastro Intestinal  No nausea, vomitting, or diarrhoea. No bright red blood per rectum, no abdominal pain, change in bowel movement, or constipation.  Genito Urinary  No frequency, urgency, dysuria, no bleeding Musculo Skeletal  No myalgia, arthralgia, joint swelling or pain  Neurologic  No weakness, numbness, change in gait,  Psychology  No depression, anxiety, insomnia.   Vitals:  Blood pressure 130/70, pulse 75, temperature 97.8 F (36.6 C), temperature source Temporal, resp. rate 18, height 5\' 4"  (1.626 m), weight 177 lb (80.3 kg), last menstrual period 10/14/2012, SpO2 99 %.  Physical Exam: WD in NAD Neck  Supple NROM, without any enlargements.  Lymph Node Survey No cervical supraclavicular or inguinal adenopathy Cardiovascular  Pulse normal rate, regularity and rhythm. S1 and S2 normal.  Lungs  Clear to auscultation bilateraly, without  wheezes/crackles/rhonchi. Good air movement.  Skin  No rash/lesions/breakdown  Psychiatry  Alert and oriented to person, place, and time  Abdomen  Normoactive bowel sounds, abdomen soft, non-tender and slightly overweight without evidence of hernia. Soft incisions.  Back No CVA tenderness Genito Urinary  Surgically absent cervix. Vaginal cuff smooth, no lesions. No pelvic masses. Pap taken.  Rectal  deferred Extremities  No bilateral cyanosis, clubbing or edema.   [name], MD  04/26/2019, 1:58 PM

## 2019-04-26 NOTE — Patient Instructions (Signed)
Dr Serita Grit office will contact you with the results of your pap smear.  Please contact Dr Serita Grit office (at [phone]) in October to request an appointment with her for February, 2022.  When you determine where you would like to have a mammogram, contact Dr Serita Grit office and they will make the referral and have your records sent to the new center.

## 2019-04-28 ENCOUNTER — Other Ambulatory Visit: Payer: Self-pay | Admitting: Gynecologic Oncology

## 2019-04-28 DIAGNOSIS — Z1231 Encounter for screening mammogram for malignant neoplasm of breast: Secondary | ICD-10-CM

## 2019-05-01 ENCOUNTER — Telehealth: Payer: Self-pay | Admitting: *Deleted

## 2019-05-01 NOTE — Telephone Encounter (Signed)
PC to patient asking if she has decided where she would like to have her mammograms done.  Pt states [facility] is fine, she has not found another location.  Pt informed mammogram will be scheduled & she will be contacted regarding appointment.  Pt verbalizes understanding.

## 2019-05-02 LAB — CYTOLOGY - PAP
Comment: NEGATIVE
Diagnosis: NEGATIVE
High risk HPV: NEGATIVE

## 2019-05-03 ENCOUNTER — Telehealth: Payer: Self-pay

## 2019-05-03 ENCOUNTER — Telehealth: Payer: Self-pay | Admitting: *Deleted

## 2019-05-03 NOTE — Telephone Encounter (Signed)
Pt notified about pap results: negative.  No questions or concerns voiced. 
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
## 2019-05-10 ENCOUNTER — Telehealth: Payer: Self-pay | Admitting: *Deleted

## 2019-05-10 NOTE — Telephone Encounter (Signed)
Called and spoke with the patient regarding her mammogram. Patient has one scheduled at [facility] on 3/1 at 7am. Patient requested to have the address to be send to her in a my chart message

## 2019-05-15 ENCOUNTER — Encounter: Payer: Self-pay | Admitting: Gynecologic Oncology
